# Patient Record
Sex: Female | Born: 1950 | Race: White | Hispanic: No | Marital: Married | Smoking: Never smoker
Health system: Southern US, Community
[De-identification: ages and names within clinical notes are randomized; demographics above are authoritative.]

## PROBLEM LIST (undated history)

## (undated) DIAGNOSIS — M5137 Other intervertebral disc degeneration, lumbosacral region: Secondary | ICD-10-CM

## (undated) DIAGNOSIS — I471 Supraventricular tachycardia, unspecified: Secondary | ICD-10-CM

## (undated) DIAGNOSIS — M51379 Other intervertebral disc degeneration, lumbosacral region without mention of lumbar back pain or lower extremity pain: Secondary | ICD-10-CM

## (undated) DIAGNOSIS — E785 Hyperlipidemia, unspecified: Secondary | ICD-10-CM

## (undated) DIAGNOSIS — Z9889 Other specified postprocedural states: Secondary | ICD-10-CM

## (undated) DIAGNOSIS — I10 Essential (primary) hypertension: Secondary | ICD-10-CM

## (undated) DIAGNOSIS — D649 Anemia, unspecified: Secondary | ICD-10-CM

## (undated) HISTORY — DX: Other intervertebral disc degeneration, lumbosacral region without mention of lumbar back pain or lower extremity pain: M51.379

## (undated) HISTORY — DX: Supraventricular tachycardia: I47.1

## (undated) HISTORY — DX: Hyperlipidemia, unspecified: E78.5

## (undated) HISTORY — DX: Supraventricular tachycardia, unspecified: I47.10

## (undated) HISTORY — DX: Other intervertebral disc degeneration, lumbosacral region: M51.37

## (undated) HISTORY — DX: Anemia, unspecified: D64.9

## (undated) HISTORY — DX: Essential (primary) hypertension: I10

---
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
## 1990-10-21 HISTORY — PX: PARTIAL HYSTERECTOMY: SHX80

## 1995-10-22 HISTORY — PX: BUNIONECTOMY: SHX129

## 2006-09-23 ENCOUNTER — Ambulatory Visit: Payer: Self-pay | Admitting: Family Medicine

## 2006-10-10 ENCOUNTER — Ambulatory Visit: Payer: Self-pay | Admitting: Family Medicine

## 2006-10-23 ENCOUNTER — Ambulatory Visit: Payer: Self-pay | Admitting: Family Medicine

## 2007-02-23 ENCOUNTER — Encounter: Payer: Self-pay | Admitting: Family Medicine

## 2007-11-09 ENCOUNTER — Encounter: Admission: RE | Admit: 2007-11-09 | Discharge: 2007-11-09 | Payer: Self-pay | Admitting: Family Medicine

## 2007-11-13 ENCOUNTER — Encounter (INDEPENDENT_AMBULATORY_CARE_PROVIDER_SITE_OTHER): Payer: Self-pay | Admitting: *Deleted

## 2007-11-19 ENCOUNTER — Encounter: Payer: Self-pay | Admitting: Family Medicine

## 2007-11-19 DIAGNOSIS — M5137 Other intervertebral disc degeneration, lumbosacral region: Secondary | ICD-10-CM | POA: Insufficient documentation

## 2007-11-20 ENCOUNTER — Ambulatory Visit: Payer: Self-pay | Admitting: Family Medicine

## 2007-11-20 DIAGNOSIS — L259 Unspecified contact dermatitis, unspecified cause: Secondary | ICD-10-CM | POA: Insufficient documentation

## 2007-11-23 ENCOUNTER — Telehealth: Payer: Self-pay | Admitting: Family Medicine

## 2007-11-23 DIAGNOSIS — I10 Essential (primary) hypertension: Secondary | ICD-10-CM | POA: Insufficient documentation

## 2007-11-25 ENCOUNTER — Telehealth (INDEPENDENT_AMBULATORY_CARE_PROVIDER_SITE_OTHER): Payer: Self-pay | Admitting: *Deleted

## 2007-11-25 DIAGNOSIS — E781 Pure hyperglyceridemia: Secondary | ICD-10-CM | POA: Insufficient documentation

## 2007-11-25 DIAGNOSIS — R945 Abnormal results of liver function studies: Secondary | ICD-10-CM | POA: Insufficient documentation

## 2007-11-25 LAB — CONVERTED CEMR LAB
BUN: 11 mg/dL (ref 6–23)
Basophils Relative: 0.1 % (ref 0.0–1.0)
Bilirubin, Direct: 0.1 mg/dL (ref 0.0–0.3)
CO2: 30 meq/L (ref 19–32)
Cholesterol: 236 mg/dL (ref 0–200)
Eosinophils Relative: 2.1 % (ref 0.0–5.0)
GFR calc Af Amer: 111 mL/min
Glucose, Bld: 96 mg/dL (ref 70–99)
Hemoglobin: 13.2 g/dL (ref 12.0–15.0)
Lymphocytes Relative: 25.4 % (ref 12.0–46.0)
MCV: 86.5 fL (ref 78.0–100.0)
Monocytes Absolute: 0.5 10*3/uL (ref 0.2–0.7)
Monocytes Relative: 6.8 % (ref 3.0–11.0)
Neutro Abs: 5 10*3/uL (ref 1.4–7.7)
Potassium: 4.3 meq/L (ref 3.5–5.1)
TSH: 1.37 microintl units/mL (ref 0.35–5.50)
Total Protein: 7.6 g/dL (ref 6.0–8.3)
VLDL: 45 mg/dL — ABNORMAL HIGH (ref 0–40)

## 2007-11-30 ENCOUNTER — Telehealth: Payer: Self-pay | Admitting: Family Medicine

## 2007-12-15 ENCOUNTER — Ambulatory Visit: Payer: Self-pay | Admitting: Family Medicine

## 2007-12-18 LAB — CONVERTED CEMR LAB
Albumin: 4.2 g/dL (ref 3.5–5.2)
Alkaline Phosphatase: 115 units/L (ref 39–117)
BUN: 12 mg/dL (ref 6–23)
GFR calc Af Amer: 83 mL/min
Hep A IgM: NEGATIVE
Hep B C IgM: NEGATIVE
Potassium: 4.4 meq/L (ref 3.5–5.1)

## 2008-01-25 ENCOUNTER — Telehealth: Payer: Self-pay | Admitting: Family Medicine

## 2008-01-27 ENCOUNTER — Encounter: Admission: RE | Admit: 2008-01-27 | Discharge: 2008-01-27 | Payer: Self-pay | Admitting: Family Medicine

## 2008-02-01 ENCOUNTER — Ambulatory Visit: Payer: Self-pay | Admitting: Family Medicine

## 2008-02-01 DIAGNOSIS — M25559 Pain in unspecified hip: Secondary | ICD-10-CM | POA: Insufficient documentation

## 2008-02-04 ENCOUNTER — Encounter: Admission: RE | Admit: 2008-02-04 | Discharge: 2008-02-04 | Payer: Self-pay | Admitting: Family Medicine

## 2008-02-08 ENCOUNTER — Encounter: Payer: Self-pay | Admitting: Family Medicine

## 2008-02-15 ENCOUNTER — Encounter: Payer: Self-pay | Admitting: Family Medicine

## 2008-02-15 LAB — HM COLONOSCOPY

## 2008-02-18 ENCOUNTER — Encounter: Payer: Self-pay | Admitting: Family Medicine

## 2008-04-11 ENCOUNTER — Ambulatory Visit: Payer: Self-pay | Admitting: Family Medicine

## 2008-04-20 LAB — CONVERTED CEMR LAB
Cholesterol: 221 mg/dL (ref 0–200)
Direct LDL: 148.7 mg/dL
Triglycerides: 134 mg/dL (ref 0–149)

## 2008-12-19 LAB — CONVERTED CEMR LAB

## 2008-12-26 ENCOUNTER — Encounter: Admission: RE | Admit: 2008-12-26 | Discharge: 2008-12-26 | Payer: Self-pay | Admitting: Family Medicine

## 2008-12-30 ENCOUNTER — Encounter: Payer: Self-pay | Admitting: Family Medicine

## 2009-01-16 ENCOUNTER — Ambulatory Visit: Payer: Self-pay | Admitting: Family Medicine

## 2009-01-16 DIAGNOSIS — M8580 Other specified disorders of bone density and structure, unspecified site: Secondary | ICD-10-CM | POA: Insufficient documentation

## 2009-01-16 LAB — CONVERTED CEMR LAB: HDL goal, serum: 40 mg/dL

## 2009-01-19 LAB — CONVERTED CEMR LAB
ALT: 26 units/L (ref 0–35)
AST: 25 units/L (ref 0–37)
Albumin: 4.4 g/dL (ref 3.5–5.2)
Alkaline Phosphatase: 99 units/L (ref 39–117)
CO2: 30 meq/L (ref 19–32)
Calcium: 9.4 mg/dL (ref 8.4–10.5)
Glucose, Bld: 92 mg/dL (ref 70–99)
Sodium: 140 meq/L (ref 135–145)
Total Protein: 7.4 g/dL (ref 6.0–8.3)
Triglycerides: 164 mg/dL — ABNORMAL HIGH (ref 0.0–149.0)

## 2009-01-20 ENCOUNTER — Encounter: Payer: Self-pay | Admitting: Family Medicine

## 2009-01-20 ENCOUNTER — Encounter: Admission: RE | Admit: 2009-01-20 | Discharge: 2009-01-20 | Payer: Self-pay | Admitting: Family Medicine

## 2009-05-02 ENCOUNTER — Ambulatory Visit: Payer: Self-pay | Admitting: Family Medicine

## 2009-05-05 LAB — CONVERTED CEMR LAB
ALT: 44 units/L — ABNORMAL HIGH (ref 0–35)
AST: 31 units/L (ref 0–37)
Total CHOL/HDL Ratio: 4
Triglycerides: 115 mg/dL (ref 0.0–149.0)

## 2009-08-02 ENCOUNTER — Ambulatory Visit: Payer: Self-pay | Admitting: Family Medicine

## 2009-08-02 DIAGNOSIS — E7800 Pure hypercholesterolemia, unspecified: Secondary | ICD-10-CM | POA: Insufficient documentation

## 2009-08-04 LAB — CONVERTED CEMR LAB
Cholesterol: 163 mg/dL (ref 0–200)
Triglycerides: 139 mg/dL (ref 0.0–149.0)

## 2010-01-08 ENCOUNTER — Encounter: Admission: RE | Admit: 2010-01-08 | Discharge: 2010-01-08 | Payer: Self-pay | Admitting: Family Medicine

## 2010-01-14 ENCOUNTER — Emergency Department: Payer: Self-pay | Admitting: Emergency Medicine

## 2010-03-14 ENCOUNTER — Ambulatory Visit: Payer: Self-pay | Admitting: Family Medicine

## 2010-03-14 DIAGNOSIS — G4700 Insomnia, unspecified: Secondary | ICD-10-CM | POA: Insufficient documentation

## 2010-03-14 LAB — HM PAP SMEAR

## 2010-03-14 LAB — CONVERTED CEMR LAB

## 2010-03-15 LAB — CONVERTED CEMR LAB
AST: 40 units/L — ABNORMAL HIGH (ref 0–37)
Alkaline Phosphatase: 106 units/L (ref 39–117)
Bilirubin, Direct: 0.1 mg/dL (ref 0.0–0.3)
CO2: 30 meq/L (ref 19–32)
Calcium: 9.9 mg/dL (ref 8.4–10.5)
Chloride: 103 meq/L (ref 96–112)
Creatinine, Ser: 0.7 mg/dL (ref 0.4–1.2)
Direct LDL: 127.9 mg/dL
Glucose, Bld: 98 mg/dL (ref 70–99)
Total Bilirubin: 0.7 mg/dL (ref 0.3–1.2)
Total CHOL/HDL Ratio: 4
Vit D, 25-Hydroxy: 40 ng/mL (ref 30–89)

## 2010-07-02 ENCOUNTER — Telehealth: Payer: Self-pay | Admitting: Family Medicine

## 2010-07-09 ENCOUNTER — Telehealth: Payer: Self-pay | Admitting: Family Medicine

## 2010-10-29 ENCOUNTER — Ambulatory Visit
Admission: RE | Admit: 2010-10-29 | Discharge: 2010-10-29 | Payer: Self-pay | Source: Home / Self Care | Attending: Family Medicine | Admitting: Family Medicine

## 2010-10-29 ENCOUNTER — Other Ambulatory Visit: Payer: Self-pay | Admitting: Family Medicine

## 2010-10-29 ENCOUNTER — Telehealth (INDEPENDENT_AMBULATORY_CARE_PROVIDER_SITE_OTHER): Payer: Self-pay | Admitting: *Deleted

## 2010-10-29 LAB — HEPATIC FUNCTION PANEL
ALT: 26 U/L (ref 0–35)
AST: 21 U/L (ref 0–37)
Albumin: 4.3 g/dL (ref 3.5–5.2)
Alkaline Phosphatase: 101 U/L (ref 39–117)
Bilirubin, Direct: 0.1 mg/dL (ref 0.0–0.3)
Total Bilirubin: 0.9 mg/dL (ref 0.3–1.2)
Total Protein: 7.3 g/dL (ref 6.0–8.3)

## 2010-10-29 LAB — CBC WITH DIFFERENTIAL/PLATELET
Basophils Absolute: 0 10*3/uL (ref 0.0–0.1)
Basophils Relative: 0.6 % (ref 0.0–3.0)
Eosinophils Absolute: 0.1 10*3/uL (ref 0.0–0.7)
Eosinophils Relative: 2 % (ref 0.0–5.0)
HCT: 39.3 % (ref 36.0–46.0)
Hemoglobin: 13.5 g/dL (ref 12.0–15.0)
Lymphocytes Relative: 26.4 % (ref 12.0–46.0)
Lymphs Abs: 1.7 10*3/uL (ref 0.7–4.0)
MCHC: 34.4 g/dL (ref 30.0–36.0)
MCV: 87.3 fl (ref 78.0–100.0)
Monocytes Absolute: 0.5 10*3/uL (ref 0.1–1.0)
Monocytes Relative: 8.3 % (ref 3.0–12.0)
Neutro Abs: 4.1 10*3/uL (ref 1.4–7.7)
Neutrophils Relative %: 62.7 % (ref 43.0–77.0)
Platelets: 297 10*3/uL (ref 150.0–400.0)
RBC: 4.5 Mil/uL (ref 3.87–5.11)
RDW: 13 % (ref 11.5–14.6)
WBC: 6.6 10*3/uL (ref 4.5–10.5)

## 2010-10-29 LAB — BASIC METABOLIC PANEL
BUN: 14 mg/dL (ref 6–23)
CO2: 27 mEq/L (ref 19–32)
Calcium: 9.7 mg/dL (ref 8.4–10.5)
Chloride: 106 mEq/L (ref 96–112)
Creatinine, Ser: 0.8 mg/dL (ref 0.4–1.2)
GFR: 82.56 mL/min (ref >60.00)
Glucose, Bld: 95 mg/dL (ref 70–99)
Potassium: 4.6 mEq/L (ref 3.5–5.1)
Sodium: 140 mEq/L (ref 135–145)

## 2010-10-29 LAB — LIPID PANEL
Cholesterol: 158 mg/dL (ref 0–200)
HDL: 39.3 mg/dL (ref >39.00)
LDL Cholesterol: 97 mg/dL (ref 0–99)
Total CHOL/HDL Ratio: 4
Triglycerides: 108 mg/dL (ref 0.0–149.0)
VLDL: 21.6 mg/dL (ref 0.0–40.0)

## 2010-10-30 ENCOUNTER — Ambulatory Visit: Admit: 2010-10-30 | Payer: Self-pay | Admitting: Family Medicine

## 2010-10-30 ENCOUNTER — Encounter (INDEPENDENT_AMBULATORY_CARE_PROVIDER_SITE_OTHER): Payer: Self-pay | Admitting: *Deleted

## 2010-11-12 ENCOUNTER — Telehealth: Payer: Self-pay | Admitting: Family Medicine

## 2010-11-20 NOTE — Progress Notes (Signed)
Summary: Martha Hayes  Phone Note Refill Request Message from:  Patient on July 09, 2010 1:24 PM  Refills Requested: Medication #1:  ZOLPIDEM TARTRATE 10 MG TABS 1/2  to 1 tab by mouth at bedtime as needed for insomnia.   Supply Requested: 1 month   Last Ref[facility]: [facility]/2010 [facility] [phone]   Method Requested: Telephone to Pharmacy Initial call taken by: Benny Lennert CMA Duncan Dull),  July 09, 2010 1:25 PM  Follow-up for Phone Call        Rx called to pharmacy Follow-up by: Benny Lennert CMA Duncan Dull),  July 10, 2010 8:32 AM    Prescriptions: ZOLPIDEM TARTRATE 10 MG TABS (ZOLPIDEM TARTRATE) 1/2  to 1 tab by mouth at bedtime as needed for insomnia  #30 x 0   Entered and Authorized by:   Kerby Nora MD   Signed by:   Kerby Nora MD on 07/10/2010   Method used:   Telephoned to ...       [facility] [facility]ail[facility]       [address]       [place], [place]  [address]       Ph: [phone] or [phone]       Fax: [phone]   RxID:   [id number]

## 2010-11-20 NOTE — Progress Notes (Signed)
Summary: refill request for ambien  Phone Note Refill Request Message from:  Fax from Pharmacy  Refills Requested: Medication #1:  ZOLPIDEM TARTRATE 10 MG TABS 1/2  to 1 tab by mouth at bedtime as needed for insomnia.   Last Refilled: 03/20/2010 [facility]ax[facility] [address]  Initial call taken by: Lowella Petties CMA,  July 02, 2010 11:15 AM  Follow-up for Phone Call        Rx called to pharmacy Follow-up by: Benny Lennert CMA Duncan Dull),  July 03, 2010 8:45 AM    Prescriptions: ZOLPIDEM TARTRATE 10 MG TABS (ZOLPIDEM TARTRATE) 1/2  to 1 tab by mouth at bedtime as needed for insomnia  #30 x 0   Entered and Authorized by:   Kerby Nora MD   Signed by:   Kerby Nora MD on 07/03/2010   Method used:   Telephoned to ...       [facility] (retail)       [address]       [place], [place]  [address]       Ph: [phone] or [phone]       Fax: [phone]   RxID:   [id number]

## 2010-11-20 NOTE — Assessment & Plan Note (Signed)
Summary: CPX/CLE   Vital Signs:  Patient profile:   60 year old female Height:      62.25 inches Weight:      138.8 pounds BMI:     25.27 Temp:     97.9 degrees F oral Pulse rate:   80 / minute Pulse rhythm:   regular BP sitting:   110 / 62  (left arm) Cuff size:   regular  Vitals Entered By: Benny Lennert CMA Duncan Dull) (Mar 14, 2010 11:24 AM)  History of Present Illness: Chief complaint cpx  The patient is here for annual wellness exam and preventative care.    Some insomnia...intermittantly..sleeps 3-4 hours at night.  requesting ambien prescription.   Minimal execise.  Osteopenia..DXA last year..On calcium and vit D.   Hypertension History:      Well controlled at home. Marland Kitchen        Positive major cardiovascular risk factors include female age 69 years old or older, hyperlipidemia, and hypertension.  Negative major cardiovascular risk factors include non-tobacco-user status.     Problems Prior to Update: 1)  Pure Hypercholesterolemia  (ICD-272.0) 2)  Special Screening For Osteoporosis  (ICD-V82.81) 3)  Hip Pain, Left  (ICD-719.45) 4)  Liver Function Tests, Abnormal  (ICD-794.8) 5)  Hypertriglyceridemia  (ICD-272.1) 6)  Hypertension, Mild  (ICD-401.1) 7)  Well Adult  (ICD-V70.0) 8)  Screening For Lipoid Disorders  (ICD-V77.91) 9)  Eczema  (ICD-692.9) 10)  Degenerative Disc Disease, Lumbosacral Spine  (ICD-722.52) 11)  Anemia-due To Menorrhagia  (ICD-285.9)  Allergies: 1)  * Penicillins Group  Past History:  Past medical, surgical, family and social histories (including risk factors) reviewed, and no changes noted (except as noted below).  Past Medical History: Reviewed history from 11/19/2007 and no changes required. Current Problems:  DEGENERATIVE DISC DISEASE, LUMBOSACRAL SPINE (ICD-722.52) ANEMIA-DUE TO MENORRHAGIA (ICD-285.9)    Past Surgical History: Reviewed history from 11/19/2007 and no changes required. 1992    Partial hysterectomy:  uterine  fibroids 1997    Bunionectomy, right 1959    tonsillectomy 11/2003 DEXA 2003    MRI lumbar:  DDD  Family History: Reviewed history from 11/19/2007 and no changes required. Father: Alive 43, DM, HTN, partial petit mal seizure disorder Mother: Died 62, CAD, two stents, DM developed in 14's, (+) osteoporosis Siblings: 1 sister, healthy No MI < 10  Social History: Reviewed history from 11/19/2007 and no changes required. Never Smoked Alcohol use-yes, occasional wine every 2 months or so Drug use-no Regular exercise-no Marital Status: Married x 36 years Children: 2, DM Type I - age 23 Occupation: RN at [employer] Diet:  fruit, veggies, no H2O, (+) soda, (+) tea, (+) coffee  Review of Systems General:  Denies fatigue and fever. CV:  Denies chest pain or discomfort. Resp:  Denies shortness of breath, sputum productive, and wheezing. GI:  Complains of bloody stools; denies abdominal pain, constipation, and diarrhea; Rarely from hemmorhoids. GU:  Denies abnormal vaginal bleeding, dysuria, nocturia, urinary frequency, and urinary hesitancy. Derm:  Denies lesion(s). Neuro:  Denies headaches and numbness. Psych:  Denies anxiety and depression.  Physical Exam  General:  Well-developed,well-nourished,in no acute distress; alert,appropriate and cooperative throughout examination Eyes:  No corneal or conjunctival inflammation noted. EOMI. Perrla. Funduscopic exam benign, without hemorrhages, exudates or papilledema. Vision grossly normal. Ears:  External ear exam shows no significant lesions or deformities.  Otoscopic examination reveals clear canals, tympanic membranes are intact bilaterally without bulging, retraction, inflammation or discharge. Hearing is grossly normal bilaterally. Nose:  External  nasal examination shows no deformity or inflammation. Nasal mucosa are pink and moist without lesions or exudates. Mouth:  Oral mucosa and oropharynx without lesions or exudates.  Teeth in good  repair. Neck:  no carotid bruit or thyromegaly no cervical or supraclavicular lymphadenopathy  Chest Wall:  No deformities, masses, or tenderness noted. Breasts:  No mass, nodules, thickening, tenderness, bulging, retraction, inflamation, nipple discharge or skin changes noted.   Lungs:  Normal respiratory effort, chest expands symmetrically. Lungs are clear to auscultation, no crackles or wheezes. Heart:  Normal rate and regular rhythm. S1 and S2 normal without gallop, murmur, click, rub or other extra sounds. Abdomen:  Bowel sounds positive,abdomen soft and non-tender without masses, organomegaly or hernias noted. Genitalia:  Pelvic Exam:        External: normal female genitalia without lesions or masses        Vagina: normal without lesions or masses Cervix: none        Adnexa: normal bimanual exam without masses or fullness        Uterus:none        Pap smear: not performed Pulses:  R and L posterior tibial pulses are full and equal bilaterally  Extremities:  no edmea Neurologic:  No cranial nerve deficits noted. Station and gait are normal. Plantar reflexes are down-going bilaterally. DTRs are symmetrical throughout. Sensory, motor and coordinative functions appear intact. Skin:  Intact without suspicious lesions or rashes Psych:  Cognition and judgment appear intact. Alert and cooperative with normal attention span and concentration. No apparent delusions, illusions, hallucinations   Impression & Recommendations:  Problem # 1:  WELL ADULT (ICD-V70.0) The patient's preventative maintenance and recommended screening tests for an annual wellness exam were reviewed in full today. Brought up to date unless services declined.  Counselled on the importance of diet, exercise, and its role in overall health and mortality. The patient's FH and SH was reviewed, including their home life, tobacco status, and drug and alcohol status.     Problem # 2:  Gynecological examination-routine  (ICD-V72.31) DVE no pap.   Problem # 3:  INSOMNIA, CHRONIC (ICD-307.42) > than 2 months.  Reviewed sleep hygeine.  Treat with ambien in limited fashoin.   Problem # 4:  PURE HYPERCHOLESTEROLEMIA (ICD-272.0) Due forreeval.  Her updated medication list for this problem includes:    Simvastatin 40 Mg Tabs (Simvastatin) .Marland Kitchen... Take 1/2  tablet by mouth once a day  Problem # 5:  OSTEOPENIA (ICD-733.90) Eval vit D. Continue ca and vit D. Start weight bearing exercsie.  Her updated medication list for this problem includes:    Calcium Citrate-vitamin D 315-200 Mg-unit Tabs (Calcium citrate-vitamin d) .Marland Kitchen... Take 1 tablet by mouth two times a day  Orders: T-Vitamin D (25-Hydroxy) ([id number])  Problem # 6:  LIVER FUNCTION TESTS, ABNORMAL (ICD-794.8) Resolved on lower dose of simvastain.Marland Kitchenreeval.  Orders: TLB-Hepatic/Liver Function Pnl ([id number]-[id number])  Complete Medication List: 1)  Ultravate 0.05 % Oint (Halobetasol propionate) .... Aaa two times a day x 2 weeks 2)  Lisinopril-hydrochlorothiazide 10-12.5 Mg Tabs (Lisinopril-hydrochlorothiazide) .... Take 1 tablet by mouth once a day 3)  Calcium Citrate-vitamin D 315-200 Mg-unit Tabs (Calcium citrate-vitamin d) .... Take 1 tablet by mouth two times a day 4)  Simvastatin 40 Mg Tabs (Simvastatin) .... Take 1/2  tablet by mouth once a day 5)  Zolpidem Tartrate 10 Mg Tabs (Zolpidem tartrate) .... 1/2  to 1 tab by mouth at bedtime as needed for insomnia  Other Orders: TLB-BMP (Basic Metabolic Panel-BMET) (80048-METABOL)  TLB-Lipid Panel (80061-LIPID)  Hypertension Assessment/Plan:      The patient's hypertensive risk group is category B: At least one risk factor (excluding diabetes) with no target organ damage.  Her calculated 10 year risk of coronary heart disease is 7 %.  Today's blood pressure is 110/62.  Her blood pressure goal is < 140/90.  Patient Instructions: 1)  Start weight bearing exercise.  2)  Sleep hygiene. 3)  Please schedule  a follow-up appointment in 1 year with fasting labs prior.  Prescriptions: SIMVASTATIN 40 MG TABS (SIMVASTATIN) Take 1/2  tablet by mouth once a day  #45 x 3   Entered and Authorized by:   Kerby Nora MD   Signed by:   Kerby Nora MD on 03/14/2010   Method used:   [facility]        [facility] [id number]       [address]       [place], [place]  [address]       Ph: [phone] or [phone]       Fax: [phone]   RxID:   [id number] ULTRAVATE 0.05 %  OINT (HALOBETASOL PROPIONATE) AAA two times a day x 2 weeks  #15 gm tube x 1   Entered and Authorized by:   Kerby Nora MD   Signed by:   Kerby Nora MD on 03/14/2010   Method used:   [facility]ically [facility]        [facility] [id number]       [address]       [place], [place]  [address]       Ph: [phone] or [phone]       Fax: [phone]   RxID:   [id number] ZOLPIDEM TARTRATE 10 MG TABS (ZOLPIDEM TARTRATE) 1/2  to 1 tab by mouth at bedtime as needed for insomnia  #30 x 0   Entered and Authorized by:   Kerby Nora MD   Signed by:   Kerby Nora MD on 03/14/2010   Method used:   Print then Give to Patient   RxID:   [id number] LISINOPRIL-HYDROCHLOROTHIAZIDE 10-12.5 MG  TABS (LISINOPRIL-HYDROCHLOROTHIAZIDE) Take 1 tablet by mouth once a day  #90 x 3   Entered and Authorized by:   Kerby Nora MD   Signed by:   Kerby Nora MD on 03/14/2010   Method used:   [facility]ronically [facility]        [facility] [id number]       [address]       [place], [place]  [address]       Ph: [phone] or [phone]       Fax: [phone]   RxID:   [id number]   Current Allergies (reviewed today): * PENICILLINS GROUP  Last Flu Vaccine:  Historical (07/22/2007 11:34:48 AM) Flu Vaccine Result Date:  07/21/2009 Flu Vaccine Result:  given Flu Vaccine Next Due:  1 yr Flex Sig Next Due:  Not Indicated Last PAP:  DVE no pap (12/19/2008 11:06:50 PM) PAP Result Date:   03/14/2010 PAP Result:  DVE no pap  PAP Next Due:  1 yr    Past Medical History:    Reviewed history from 11/19/2007 and no changes required:       Current Problems:        DEGENERATIVE DISC DISEASE, LUMBOSACRAL SPINE (ICD-722.52)       ANEMIA-DUE TO MENORRHAGIA (ICD-285.9)          Past Surgical History:  Reviewed history from 11/19/2007 and no changes required:       1992    Partial hysterectomy:  uterine fibroids       1997    Bunionectomy, right       1959    tonsillectomy       11/2003 DEXA       2003    MRI lumbar:  DDD  Appended Document: CPX/CLE

## 2010-11-22 NOTE — Progress Notes (Signed)
Summary: ZOLPIDEM TARTRATE  Phone Note Refill Request Message from:  Fax from Pharmacy on November 12, 2010 9:23 AM  Refills Requested: Medication #1:  ZOLPIDEM TARTRATE 10 MG TABS 1/2  to 1 tab by mouth at bedtime as needed for insomnia.   Last Refilled: 07/10/2010 [facility] [phone]  Initial call taken by: Melody Comas,  November 12, 2010 9:23 AM  Follow-up for Phone Call        Rx called to pharmacy  Additional Follow-up for Phone Call Additional follow up Details #1::       Additional Follow-up by: Benny Lennert CMA Duncan Dull),  November 13, 2010 7:33 AM    Prescriptions: ZOLPIDEM TARTRATE 10 MG TABS (ZOLPIDEM TARTRATE) 1/2  to 1 tab by mouth at bedtime as needed for insomnia  #30 x 0   Entered and Authorized by:   Kerby Nora MD   Signed by:   Kerby Nora MD on 11/12/2010   Method used:   Telephoned to ...       [facility] (retail)       [address]       [place], [place]  [address]       Ph: [phone] or [phone]       Fax: [phone]   RxID:   [id number]

## 2010-11-22 NOTE — Progress Notes (Signed)
----   Converted from flag ---- ---- 10/26/2010 9:57 PM, Kerby Nora MD wrote: Dx 272.0 CMET, lipids, Dx 285.9 cbc   ---- 10/26/2010 12:12 PM, Liane Comber CMA (AAMA) wrote: Lab orders please! Good Morning! This pt is scheduled for cpx labs Monday, which labs to draw and dx codes to use? Thanks Tasha ------------------------------

## 2010-11-22 NOTE — Letter (Signed)
Summary: Results Follow up Letter  [place] [facility]  [address] [place], [place] [address]   Phone: [phone]  Fax: [phone]    10/30/2010 MRN: [id number]     [facility] [address] [place], [place]  [address]    Dear Ms. Ferrington,  The following are the results of your recent test(s):  Test         Result    Pap Smear:        Normal _____  Not Normal _____ Comments: ______________________________________________________ Cholesterol: LDL at goal <130, good HDl and triglycerides at goal, normal kidney function , normal  liver function and  no diabetes.  no anemia.  ______________________________________________________ Mammogram:        Normal _____  Not Normal _____ Comments:  ___________________________________________________________________ Hemoccult:        Normal _____  Not normal _______ Comments:    _____________________________________________________________________ Other Tests:    We routinely do not discuss normal results over the telephone.  If you desire a copy of the results, or you have any questions about this information we can discuss them at your next office visit.   Sincerely,  Kerby Nora MD

## 2011-01-11 ENCOUNTER — Other Ambulatory Visit: Payer: Self-pay | Admitting: Family Medicine

## 2011-01-11 DIAGNOSIS — Z1231 Encounter for screening mammogram for malignant neoplasm of breast: Secondary | ICD-10-CM

## 2011-01-15 ENCOUNTER — Other Ambulatory Visit: Payer: Self-pay | Admitting: *Deleted

## 2011-01-15 MED ORDER — ZOLPIDEM TARTRATE 10 MG PO TABS: ORAL_TABLET | ORAL | Status: DC

## 2011-01-15 NOTE — Telephone Encounter (Signed)
Notify pt prescription called in.

## 2011-01-15 NOTE — Telephone Encounter (Signed)
Rx called to pharmacy, patient notified. 

## 2011-01-15 NOTE — Telephone Encounter (Signed)
Opened in error. Medication already called in to pharmacy.

## 2011-01-21 ENCOUNTER — Ambulatory Visit
Admission: RE | Admit: 2011-01-21 | Discharge: 2011-01-21 | Disposition: A | Discharge status: Home / Self Care | Payer: BC Managed Care – PPO | Source: Ambulatory Visit | Attending: Family Medicine | Admitting: Family Medicine

## 2011-01-21 DIAGNOSIS — Z1231 Encounter for screening mammogram for malignant neoplasm of breast: Secondary | ICD-10-CM

## 2011-01-22 ENCOUNTER — Encounter: Payer: Self-pay | Admitting: Family Medicine

## 2011-01-22 ENCOUNTER — Ambulatory Visit (INDEPENDENT_AMBULATORY_CARE_PROVIDER_SITE_OTHER): Payer: BC Managed Care – PPO | Admitting: Family Medicine

## 2011-01-22 DIAGNOSIS — F411 Generalized anxiety disorder: Secondary | ICD-10-CM

## 2011-01-22 MED ORDER — SERTRALINE HCL 50 MG PO TABS: 50.0000 mg | ORAL_TABLET | Freq: Every day | ORAL | Status: DC

## 2011-01-22 NOTE — Patient Instructions (Signed)
Work on stress reduction and relaxation techniques. Start regular exercise. Start sertraline at bedtime.

## 2011-01-22 NOTE — Progress Notes (Signed)
  Subjective:    Patient ID: Martha Hayes, female    DOB: 1951-09-30, 60 y.o.   MRN: [id number]  HPI  In last 3-6 months she has had worsened anxiety. At work she feels very anxious, overwhelmed. Constantly while she is at work.. Does well at home.  No panic attacks.. More generalized symptoms. No SI. No depression.  Sleeping at night on [place].. Using 1/2 tab nightly.  Never been on SSRI in past.  Plans to retire in 2 years.   Review of Systems     Objective:   Physical Exam        Assessment & Plan:

## 2011-01-22 NOTE — Assessment & Plan Note (Signed)
New diagnosis but chronic. Start stress reduction relaxation and SSRI.  Discussed risk and benefits of medication... As well as expected SE profile and expected effectiveness timeline.

## 2011-01-24 ENCOUNTER — Encounter: Payer: Self-pay | Admitting: *Deleted

## 2011-03-01 ENCOUNTER — Ambulatory Visit: Payer: BC Managed Care – PPO | Admitting: Family Medicine

## 2011-03-08 NOTE — Assessment & Plan Note (Signed)
[facility]                           [facility] OFFICE NOTE   AMAYRANI, BENNICK                           MRN:          [id number]  DATE:09/23/2006                            DOB:          1951-01-07    CHIEF COMPLAINT:  A 60 year old white female here to establish new  doctor.   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  She comes to the clinic today to be set up  with a primary care doctor.  She states that she has been doing very  well and does not have any specific complaints.  She does report that  she had a history of blood in her stool in 2005.  She had a normal  colonoscopy.  At that point in time, though, the GI doctor performing  the colonoscopy had recommended following up with stool cards.  She  never did follow up with stool cards.  She would like this done at this  point in time.   REVIEW OF SYSTEMS:  No headache, no dizziness, no syncope, no chest  pain, no palpitations, no dyspnea, no appetite problems, no nausea, no  vomiting, no diarrhea.  Occasional constipation.  History of occasional  small amount of bright red blood on toilet tissue after straining.   PAST MEDICAL HISTORY:  1. History of anemia secondary to menorrhagia now resolved.  2. Degenerative disk disease low back.   HOSPITALIZATIONS SURGERIES PROCEDURES:  1. 1992 partial hysterectomy for uterine fibroids.  2. 1997 bunionectomy on the right.  3. 1959 tonsillectomy.  4. February of 2005 DEXA scan.  5. 2003 MRI of lumbar spine showing degenerative disk disease.  6. Mammogram December of 2007 negative.  7. Colonoscopy negative in 2005.   FAMILY HISTORY:  Father alive at age 6 with diabetes, hypertension, and  partial seizure disorder.  Mother deceased at age 27 with coronary  artery disease, status post two stents, diabetes, osteoporosis.  There  is no family history of MI before age 71.  She has one  sister who is  healthy.  There is no family history of cancer.   SOCIAL HISTORY:  She is a Engineer, civil (consulting) at [employer].  She has  been married for 36 years.  She has two children, one of whom has  diabetes type 1 at age 70.  No regular exercise.  Diet includes  occasional fruits and vegetables, limited water.  She also drinks soda,  tea, and coffee.   PHYSICAL EXAMINATION:  VITAL SIGNS:  Height 62-1/2 inches, weight 133.6,  blood pressure 122/72, pulse 80, temperature 98.2.  GENERAL:  Healthy appearing female in no acute distress.  HEENT:  PERRLA.  Extraocular muscles intact.  Oropharynx clear.  Tympanic membranes clear.  No thyromegaly and no lymphadenopathy  supraclavicular or cervical.  HEART:  Regular rate and rhythm.  No murmurs, rubs, or gallops.  Normal  PMI, 2+ peripheral pulses, no peripheral edema.  LUNGS:  Clear to auscultation bilaterally, no wheezes, rales, or  rhonchi.  ABDOMEN:  Soft and nontender.  Normal active bowel sounds, no  hepatosplenomegaly.  MUSCULOSKELETAL:  Strength 5/5 in upper and lower extremities.  Right  thumb tender to palpation at base, clicking and popping noted as well as  tendon nodule palpated suggesting trigger finger.  Full strength in  thumb abduction, adduction, extension, and flexion.  NEUROLOGY:  Cranial nerves II-XII grossly intact.  Alert and oriented  x3.  Reflexes 2+ upper and lower extremities.   ASSESSMENT:  1. Follow up on blood in stool.  This is most likely secondary to      straining and possible rectal fissure.  She had no evidence of      hemorrhoids on colonoscopy.  I will have her repeat a hemoccult to      make sure that there is no continued small loss of blood.  2. Right trigger thumb.  She will use ibuprofen, ice, and will tape      her finger to see if this improves her symptoms over time.  She      will return if she continues to have problems for a trigger finger      nodule steroid injection.  3.  Prevention; she is currently up-to-date with prevention.  We did      discuss checking a cholesterol panel and a diabetes screen.  She      did have this and it was normal in September of 2006.  She is at      her goal cholesterol less than 160.  We will hold off and check      cholesterol next year.     Kerby Nora, MD  Electronically Signed    AB/MedQ  DD: 09/23/2006  DT: 09/24/2006  Job #: [id number]

## 2011-03-11 ENCOUNTER — Other Ambulatory Visit: Payer: Self-pay | Admitting: *Deleted

## 2011-03-11 MED ORDER — ZOLPIDEM TARTRATE 10 MG PO TABS: ORAL_TABLET | ORAL | Status: DC

## 2011-03-14 ENCOUNTER — Other Ambulatory Visit: Payer: Self-pay | Admitting: *Deleted

## 2011-04-11 ENCOUNTER — Other Ambulatory Visit: Payer: Self-pay | Admitting: Family Medicine

## 2011-04-15 ENCOUNTER — Ambulatory Visit (INDEPENDENT_AMBULATORY_CARE_PROVIDER_SITE_OTHER): Payer: BC Managed Care – PPO | Admitting: Family Medicine

## 2011-04-15 ENCOUNTER — Encounter: Payer: Self-pay | Admitting: Family Medicine

## 2011-04-15 DIAGNOSIS — Z23 Encounter for immunization: Secondary | ICD-10-CM

## 2011-04-15 DIAGNOSIS — G4700 Insomnia, unspecified: Secondary | ICD-10-CM

## 2011-04-15 DIAGNOSIS — R945 Abnormal results of liver function studies: Secondary | ICD-10-CM

## 2011-04-15 DIAGNOSIS — Z2911 Encounter for prophylactic immunotherapy for respiratory syncytial virus (RSV): Secondary | ICD-10-CM

## 2011-04-15 DIAGNOSIS — E7800 Pure hypercholesterolemia, unspecified: Secondary | ICD-10-CM

## 2011-04-15 DIAGNOSIS — Z0000 Encounter for general adult medical examination without abnormal findings: Secondary | ICD-10-CM

## 2011-04-15 DIAGNOSIS — F411 Generalized anxiety disorder: Secondary | ICD-10-CM

## 2011-04-15 DIAGNOSIS — I10 Essential (primary) hypertension: Secondary | ICD-10-CM

## 2011-04-15 MED ORDER — SERTRALINE HCL 100 MG PO TABS: 100.0000 mg | ORAL_TABLET | Freq: Every day | ORAL | Status: DC

## 2011-04-15 MED ORDER — ZOLPIDEM TARTRATE 10 MG PO TABS: 5.0000 mg | ORAL_TABLET | Freq: Every evening | ORAL | Status: DC | PRN

## 2011-04-15 NOTE — Assessment & Plan Note (Signed)
Refilled ambien daily.

## 2011-04-15 NOTE — Patient Instructions (Addendum)
Okay to stop BP medicaiton. Follow BPs closely. Restart medication if BPs running 140/90. Increase sertraline to 100 mg daily. Okay to stop cholesterol medication.  Return for fasting lipids in 3 months.   When scheduling the mammogram next year remember to schedule bone density as well.

## 2011-04-15 NOTE — Assessment & Plan Note (Signed)
Improved 80%, but still room for improvement. Will try trial of 100 mg daily.

## 2011-04-15 NOTE — Progress Notes (Signed)
Subjective:    Patient ID: Martha Hayes, female    DOB: [birth year], 60 y.o.   MRN: [id number]  HPI  The patient is here for annual wellness exam and preventative care.    Elevated Cholesterol: well controlled on simvastatin 40 mg daily. Using medications without problems: Yes Muscle aches: None Has been working on [employer]... She has lost 10 lbs. Minimal exercise.  Interested in stopping chol med.   LFTs in past elevated.Marland Kitchen Resolved on lower dose statin.    Hypertension:  Well controlled on lisinopril HCTZ.   Using medication without problems or lightheadedness:  Chest pain with exertion:None Edema:None Short of breath:None Average home BPs: well controlled...has lost 10 lbs since last summer, and improvement in anxiety is helping BP. She is intrested in trial off BP meds.  Other issues:   Anxiety: On sertraline 50 mg since April  Using ambien at bedtime for insomnia.  She feels like she is about 80% better on the current dose, but still having some overwhelming feelings. She is interested in higher dose of the medicine. No SE to the medication.  Review of Systems     Objective:   Physical Exam  Constitutional: Vital signs are normal. She appears well-developed and well-nourished. She is cooperative.  Non-toxic appearance. She does not appear ill. No distress.  HENT:  Head: Normocephalic.  Right Ear: Hearing, tympanic membrane, external ear and ear canal normal.  Left Ear: Hearing, tympanic membrane, external ear and ear canal normal.  Nose: Nose normal.  Eyes: Conjunctivae, EOM and lids are normal. Pupils are equal, round, and reactive to light. No foreign bodies found.  Neck: Trachea normal and normal range of motion. Neck supple. Carotid bruit is not present. No mass and no thyromegaly present.  Cardiovascular: Normal rate, regular rhythm, S1 normal, S2 normal, normal heart sounds and intact distal pulses.  Exam reveals no gallop.   No murmur  heard. Pulmonary/Chest: Effort normal and breath sounds normal. No respiratory distress. She has no wheezes. She has no rhonchi. She has no rales.  Abdominal: Soft. Normal appearance and bowel sounds are normal. She exhibits no distension, no fluid wave, no abdominal bruit and no mass. There is no hepatosplenomegaly. There is no tenderness. There is no rebound, no guarding and no CVA tenderness. No hernia. Hernia confirmed negative in the right inguinal area and confirmed negative in the left inguinal area.  Genitourinary: Vagina normal. No breast swelling, tenderness, discharge or bleeding. Pelvic exam was performed with patient prone. No labial fusion. There is no rash, tenderness, lesion or injury on the right labia. There is no rash, tenderness, lesion or injury on the left labia. Right adnexum displays no mass, no tenderness and no fullness. Left adnexum displays no mass, no tenderness and no fullness. No erythema, tenderness or bleeding around the vagina. No foreign body around the vagina. No signs of injury around the vagina. No vaginal discharge found.  Lymphadenopathy:    She has no cervical adenopathy.    She has no axillary adenopathy.       Right: No inguinal adenopathy present.       Left: No inguinal adenopathy present.  Neurological: She is alert. She has normal strength. No cranial nerve deficit or sensory deficit.  Skin: Skin is warm, dry and intact. No rash noted.  Psychiatric: She has a normal mood and affect. Her speech is normal and behavior is normal. Judgment normal. Her mood appears not anxious. Cognition and memory are normal. She does not exhibit  a depressed mood.          Assessment & Plan:

## 2011-04-15 NOTE — Assessment & Plan Note (Signed)
Well controlled... Will try trial off BP med. Follow  BP at home.

## 2011-04-15 NOTE — Assessment & Plan Note (Signed)
Very well controlled. We will try trial off simvastatin . Recheck in 3 months.

## 2011-04-15 NOTE — Assessment & Plan Note (Signed)
Resolved

## 2011-05-22 ENCOUNTER — Other Ambulatory Visit: Payer: Self-pay | Admitting: Family Medicine

## 2011-07-10 ENCOUNTER — Other Ambulatory Visit: Payer: Self-pay | Admitting: *Deleted

## 2011-07-10 MED ORDER — ZOLPIDEM TARTRATE 10 MG PO TABS: 5.0000 mg | ORAL_TABLET | Freq: Every evening | ORAL | Status: DC | PRN

## 2011-07-10 NOTE — Telephone Encounter (Signed)
Rx called to pharmacy

## 2011-09-09 ENCOUNTER — Other Ambulatory Visit: Payer: Self-pay | Admitting: *Deleted

## 2011-09-10 MED ORDER — ZOLPIDEM TARTRATE 10 MG PO TABS: 5.0000 mg | ORAL_TABLET | Freq: Every evening | ORAL | Status: DC | PRN

## 2011-09-10 NOTE — Telephone Encounter (Signed)
rx called to pharmacy 

## 2011-09-10 NOTE — Telephone Encounter (Signed)
Ok to refill #30, 0 refills 

## 2011-11-26 ENCOUNTER — Other Ambulatory Visit: Payer: Self-pay | Admitting: *Deleted

## 2011-11-26 MED ORDER — ZOLPIDEM TARTRATE 10 MG PO TABS: 5.0000 mg | ORAL_TABLET | Freq: Every evening | ORAL | Status: DC | PRN

## 2011-11-26 NOTE — Telephone Encounter (Signed)
Ok, #30, 0 refills 

## 2011-11-26 NOTE — Telephone Encounter (Signed)
RX CALLED TO PHARMACY  

## 2012-01-31 ENCOUNTER — Other Ambulatory Visit: Payer: Self-pay | Admitting: *Deleted

## 2012-01-31 NOTE — Telephone Encounter (Signed)
Faxed request from [facility], no last filled date given. 

## 2012-02-03 ENCOUNTER — Other Ambulatory Visit: Payer: Self-pay | Admitting: Family Medicine

## 2012-02-03 DIAGNOSIS — Z1231 Encounter for screening mammogram for malignant neoplasm of breast: Secondary | ICD-10-CM

## 2012-02-03 MED ORDER — ZOLPIDEM TARTRATE 10 MG PO TABS: 5.0000 mg | ORAL_TABLET | Freq: Every evening | ORAL | Status: DC | PRN

## 2012-02-03 NOTE — Telephone Encounter (Signed)
Medicine called to pharmacy. 

## 2012-02-17 ENCOUNTER — Ambulatory Visit
Admission: RE | Admit: 2012-02-17 | Discharge: 2012-02-17 | Disposition: A | Discharge status: Home / Self Care | Payer: BC Managed Care – PPO | Source: Ambulatory Visit | Attending: Family Medicine | Admitting: Family Medicine

## 2012-02-17 DIAGNOSIS — Z1231 Encounter for screening mammogram for malignant neoplasm of breast: Secondary | ICD-10-CM

## 2012-02-19 ENCOUNTER — Other Ambulatory Visit: Payer: Self-pay | Admitting: Family Medicine

## 2012-02-19 DIAGNOSIS — R928 Other abnormal and inconclusive findings on diagnostic imaging of breast: Secondary | ICD-10-CM

## 2012-02-24 ENCOUNTER — Ambulatory Visit
Admission: RE | Admit: 2012-02-24 | Discharge: 2012-02-24 | Disposition: A | Discharge status: Home / Self Care | Payer: BC Managed Care – PPO | Source: Ambulatory Visit | Attending: Family Medicine | Admitting: Family Medicine

## 2012-02-24 DIAGNOSIS — R928 Other abnormal and inconclusive findings on diagnostic imaging of breast: Secondary | ICD-10-CM

## 2012-02-24 LAB — HM MAMMOGRAPHY

## 2012-02-25 ENCOUNTER — Encounter: Payer: Self-pay | Admitting: *Deleted

## 2012-02-25 ENCOUNTER — Encounter: Payer: Self-pay | Admitting: Family Medicine

## 2012-04-10 ENCOUNTER — Other Ambulatory Visit: Payer: Self-pay | Admitting: *Deleted

## 2012-04-10 MED ORDER — ZOLPIDEM TARTRATE 10 MG PO TABS: 5.0000 mg | ORAL_TABLET | Freq: Every evening | ORAL | Status: DC | PRN

## 2012-04-20 ENCOUNTER — Other Ambulatory Visit: Payer: BC Managed Care – PPO

## 2012-04-24 ENCOUNTER — Encounter: Payer: BC Managed Care – PPO | Admitting: Family Medicine

## 2012-05-05 ENCOUNTER — Telehealth: Payer: Self-pay | Admitting: Family Medicine

## 2012-05-05 DIAGNOSIS — M899 Disorder of bone, unspecified: Secondary | ICD-10-CM

## 2012-05-05 DIAGNOSIS — I10 Essential (primary) hypertension: Secondary | ICD-10-CM

## 2012-05-05 DIAGNOSIS — M949 Disorder of cartilage, unspecified: Secondary | ICD-10-CM

## 2012-05-05 DIAGNOSIS — E7800 Pure hypercholesterolemia, unspecified: Secondary | ICD-10-CM

## 2012-05-05 NOTE — Telephone Encounter (Signed)
Message copied by Excell Seltzer on Tue May 05, 2012  2:35 PM ------      Message from: Alvina Chou      Created: Thu Apr 30, 2012 10:53 AM      Regarding: lab orders for Thursday 7.18.13       Patient is scheduled for CPX labs, please order future labs, Thanks , Camelia Eng

## 2012-05-07 ENCOUNTER — Other Ambulatory Visit (INDEPENDENT_AMBULATORY_CARE_PROVIDER_SITE_OTHER): Payer: BC Managed Care – PPO

## 2012-05-07 DIAGNOSIS — M899 Disorder of bone, unspecified: Secondary | ICD-10-CM

## 2012-05-07 DIAGNOSIS — E7800 Pure hypercholesterolemia, unspecified: Secondary | ICD-10-CM

## 2012-05-07 DIAGNOSIS — M949 Disorder of cartilage, unspecified: Secondary | ICD-10-CM

## 2012-05-07 LAB — LIPID PANEL: Cholesterol: 237 mg/dL — ABNORMAL HIGH (ref 0–200)

## 2012-05-07 LAB — COMPREHENSIVE METABOLIC PANEL
ALT: 27 U/L (ref 0–35)
Albumin: 4.3 g/dL (ref 3.5–5.2)
CO2: 26 mEq/L (ref 19–32)
Calcium: 9.2 mg/dL (ref 8.4–10.5)
Chloride: 107 mEq/L (ref 96–112)
GFR: 78.55 mL/min (ref >60.00)
Glucose, Bld: 99 mg/dL (ref 70–99)
Potassium: 4.7 mEq/L (ref 3.5–5.1)
Sodium: 142 mEq/L (ref 135–145)
Total Protein: 7.4 g/dL (ref 6.0–8.3)

## 2012-05-07 LAB — LDL CHOLESTEROL, DIRECT: Direct LDL: 153.4 mg/dL

## 2012-05-12 ENCOUNTER — Ambulatory Visit (INDEPENDENT_AMBULATORY_CARE_PROVIDER_SITE_OTHER): Payer: BC Managed Care – PPO | Admitting: Family Medicine

## 2012-05-12 ENCOUNTER — Encounter: Payer: Self-pay | Admitting: Family Medicine

## 2012-05-12 VITALS — BP 150/72 | HR 80 | Temp 98.3°F | Wt 122.0 lb

## 2012-05-12 DIAGNOSIS — F411 Generalized anxiety disorder: Secondary | ICD-10-CM

## 2012-05-12 DIAGNOSIS — Z01419 Encounter for gynecological examination (general) (routine) without abnormal findings: Secondary | ICD-10-CM

## 2012-05-12 DIAGNOSIS — E781 Pure hyperglyceridemia: Secondary | ICD-10-CM

## 2012-05-12 DIAGNOSIS — Z0000 Encounter for general adult medical examination without abnormal findings: Secondary | ICD-10-CM

## 2012-05-12 DIAGNOSIS — I10 Essential (primary) hypertension: Secondary | ICD-10-CM

## 2012-05-12 DIAGNOSIS — E7800 Pure hypercholesterolemia, unspecified: Secondary | ICD-10-CM

## 2012-05-12 MED ORDER — SIMVASTATIN 20 MG PO TABS: 20.0000 mg | ORAL_TABLET | Freq: Every evening | ORAL | Status: DC

## 2012-05-12 NOTE — Progress Notes (Signed)
Subjective:    Patient ID: Martha Hayes, female    DOB: September 22, 1951, 61 y.o.   MRN: [id number]  HPI   The patient's preventative maintenance and recommended screening tests for an annual wellness exam were reviewed in full today. Brought up to date unless services declined.  Counselled on the importance of diet, exercise, and its role in overall health and mortality. The patient's FH and SH was reviewed, including their home life, tobacco status, and drug and alcohol status.    Hypertension:  On no med.Marland Kitchen Poorly controlled today. Previously well controlled on lisinopril/HCTZ.  Using medication without problems or lightheadedness: None Chest pain with exertion: None Edema:None Short of breath:None Average home BPs: 135/70 Other issues:  Elevated Cholesterol:  On no medicaiton, previously on zocor .Marland Kitchen LDL not at goal <130 Lab Results  Component Value Date   CHOL 237* 05/07/2012   HDL 46.80 05/07/2012   LDLCALC 97 10/29/2010   LDLDIRECT 153.4 05/07/2012   TRIG 137.0 05/07/2012   CHOLHDL 5 05/07/2012   Diet compliance: Good, healthy eating. Has lost 18 lbs since last year.  HAs been working hard on low cholesterol diet.  Exercise: None Other complaints:   Anxiety: On zoloft 100 mg , uses ambien for insomnia. Plans to retire and will come off medication then.   Review of Systems  Constitutional: Negative for fever, fatigue and unexpected weight change.  HENT: Negative for ear pain, congestion, sore throat, sneezing, trouble swallowing and sinus pressure.   Eyes: Negative for pain and itching.  Respiratory: Negative for cough, shortness of breath and wheezing.   Cardiovascular: Negative for chest pain, palpitations and leg swelling.  Gastrointestinal: Negative for nausea, abdominal pain, diarrhea, constipation and blood in stool.  Genitourinary: Negative for dysuria, hematuria, vaginal discharge, difficulty urinating and menstrual problem.  Skin: Negative for rash.  Neurological:  Negative for syncope, weakness, light-headedness, numbness and headaches.  Psychiatric/Behavioral: Negative for confusion and dysphoric mood. The patient is not nervous/anxious.        Objective:   Physical Exam  Constitutional: Vital signs are normal. She appears well-developed and well-nourished. She is cooperative.  Non-toxic appearance. She does not appear ill. No distress.  HENT:  Head: Normocephalic.  Right Ear: Hearing, tympanic membrane, external ear and ear canal normal.  Left Ear: Hearing, tympanic membrane, external ear and ear canal normal.  Nose: Nose normal.  Eyes: Conjunctivae, EOM and lids are normal. Pupils are equal, round, and reactive to light. No foreign bodies found.  Neck: Trachea normal and normal range of motion. Neck supple. Carotid bruit is not present. No mass and no thyromegaly present.  Cardiovascular: Normal rate, regular rhythm, S1 normal, S2 normal, normal heart sounds and intact distal pulses.  Exam reveals no gallop.   No murmur heard. Pulmonary/Chest: Effort normal and breath sounds normal. No respiratory distress. She has no wheezes. She has no rhonchi. She has no rales.  Abdominal: Soft. Normal appearance and bowel sounds are normal. She exhibits no distension, no fluid wave, no abdominal bruit and no mass. There is no hepatosplenomegaly. There is no tenderness. There is no rebound, no guarding and no CVA tenderness. No hernia.  Genitourinary: Vagina normal and uterus normal. No breast swelling, tenderness, discharge or bleeding. Pelvic exam was performed with patient prone. There is no rash, tenderness or lesion on the right labia. There is no rash, tenderness or lesion on the left labia. Uterus is not enlarged and not tender. Right adnexum displays no mass, no tenderness and no fullness.  Left adnexum displays no mass, no tenderness and no fullness.  Lymphadenopathy:    She has no cervical adenopathy.    She has no axillary adenopathy.  Neurological: She  is alert. She has normal strength. No cranial nerve deficit or sensory deficit.  Skin: Skin is warm, dry and intact. No rash noted.  Psychiatric: Her speech is normal and behavior is normal. Judgment normal. Her mood appears not anxious. Cognition and memory are normal. She does not exhibit a depressed mood.          Assessment & Plan:  The patient's preventative maintenance and recommended screening tests for an annual wellness exam were reviewed in full today. Brought up to date unless services declined.  Counselled on the importance of diet, exercise, and its role in overall health and mortality. The patient's FH and SH was reviewed, including their home life, tobacco status, and drug and alcohol status.   Vaccines:Uptodate with Td and zoster  PAP/DVE: partial hysterectomy, yearly DVE. 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 Plan repeat US in 6 months.  Colonoscopy: Dr. Ewing Schlein Date: 02/15/2008  Next Due: 02/2018  Results: Diverticulosis  DEXA: 2010.. Osteopenia.Marland Kitchen Has not been taking ca and vit D. Does not want to do DEXA this year.

## 2012-05-12 NOTE — Assessment & Plan Note (Signed)
Stable control on zoloft. No SI, no HI.

## 2012-05-12 NOTE — Patient Instructions (Addendum)
Follow BP at home.Marland Kitchen Restart lisinopril/HCTZ if running >140/90. Restart simvastatin 20 mg daily. Start routine exercise regimen 3-5 days a week... Could help with cholesterol!!! Return for fasting labs in 3 months.

## 2012-05-12 NOTE — Assessment & Plan Note (Signed)
Inadequate control off med.Marland Kitchen Restart simvastatin 20 mg daily. Recheck in 3 months.

## 2012-05-12 NOTE — Assessment & Plan Note (Signed)
Folow at home, restart lisinopril HCTZ if greater than 140/90. Encouraged exercise, weight loss, healthy eating habits.

## 2012-05-14 ENCOUNTER — Other Ambulatory Visit: Payer: Self-pay | Admitting: Family Medicine

## 2012-06-19 ENCOUNTER — Other Ambulatory Visit: Payer: Self-pay

## 2012-06-19 MED ORDER — ZOLPIDEM TARTRATE 10 MG PO TABS: 5.0000 mg | ORAL_TABLET | Freq: Every evening | ORAL | Status: DC | PRN

## 2012-06-19 NOTE — Telephone Encounter (Signed)
Medication phoned to [facility] pharmacy as instructed.  

## 2012-06-19 NOTE — Telephone Encounter (Signed)
[facility] faxed request [email or web address] filled 05/20/12.

## 2012-08-05 ENCOUNTER — Other Ambulatory Visit: Payer: Self-pay | Admitting: Family Medicine

## 2012-08-05 DIAGNOSIS — N63 Unspecified lump in unspecified breast: Secondary | ICD-10-CM

## 2012-08-27 ENCOUNTER — Encounter: Payer: Self-pay | Admitting: Family Medicine

## 2012-08-27 ENCOUNTER — Ambulatory Visit
Admission: RE | Admit: 2012-08-27 | Discharge: 2012-08-27 | Disposition: A | Discharge status: Home / Self Care | Payer: BC Managed Care – PPO | Source: Ambulatory Visit | Attending: Family Medicine | Admitting: Family Medicine

## 2012-08-27 DIAGNOSIS — N63 Unspecified lump in unspecified breast: Secondary | ICD-10-CM

## 2012-10-20 ENCOUNTER — Other Ambulatory Visit: Payer: Self-pay | Admitting: Family Medicine

## 2012-10-20 MED ORDER — ZOLPIDEM TARTRATE 10 MG PO TABS: 5.0000 mg | ORAL_TABLET | Freq: Every evening | ORAL | Status: DC | PRN

## 2012-10-20 NOTE — Addendum Note (Signed)
Addended by: Kerby Nora E on: 10/20/2012 05:11 PM   Modules accepted: Orders

## 2012-10-20 NOTE — Telephone Encounter (Signed)
Medication phoned to [facility] pharmacy as instructed.  

## 2013-02-18 ENCOUNTER — Other Ambulatory Visit: Payer: Self-pay | Admitting: Family Medicine

## 2013-02-18 DIAGNOSIS — N63 Unspecified lump in unspecified breast: Secondary | ICD-10-CM

## 2013-03-10 ENCOUNTER — Ambulatory Visit
Admission: RE | Admit: 2013-03-10 | Discharge: 2013-03-10 | Disposition: A | Discharge status: Home / Self Care | Payer: BC Managed Care – PPO | Source: Ambulatory Visit | Attending: Family Medicine | Admitting: Family Medicine

## 2013-03-10 DIAGNOSIS — N63 Unspecified lump in unspecified breast: Secondary | ICD-10-CM

## 2013-05-05 ENCOUNTER — Encounter: Payer: Self-pay | Admitting: Radiology

## 2013-05-06 ENCOUNTER — Other Ambulatory Visit (INDEPENDENT_AMBULATORY_CARE_PROVIDER_SITE_OTHER): Payer: BC Managed Care – PPO

## 2013-05-06 ENCOUNTER — Telehealth: Payer: Self-pay | Admitting: Family Medicine

## 2013-05-06 DIAGNOSIS — E7800 Pure hypercholesterolemia, unspecified: Secondary | ICD-10-CM

## 2013-05-06 DIAGNOSIS — I10 Essential (primary) hypertension: Secondary | ICD-10-CM

## 2013-05-06 DIAGNOSIS — E781 Pure hyperglyceridemia: Secondary | ICD-10-CM

## 2013-05-06 DIAGNOSIS — M899 Disorder of bone, unspecified: Secondary | ICD-10-CM

## 2013-05-06 DIAGNOSIS — R945 Abnormal results of liver function studies: Secondary | ICD-10-CM

## 2013-05-06 NOTE — Telephone Encounter (Signed)
Message copied by Excell Seltzer on Thu May 06, 2013  8:18 AM ------      Message from: Alvina Chou      Created: Mon May 03, 2013 10:48 AM      Regarding: Lab orders for Thursday, 7.17.14       Patient is scheduled for CPX labs, please order future labs, Thanks , Terri       ------

## 2013-05-13 ENCOUNTER — Encounter: Payer: BC Managed Care – PPO | Admitting: Family Medicine

## 2013-05-18 ENCOUNTER — Encounter: Payer: Self-pay | Admitting: Family Medicine

## 2013-05-18 ENCOUNTER — Ambulatory Visit (INDEPENDENT_AMBULATORY_CARE_PROVIDER_SITE_OTHER): Payer: BC Managed Care – PPO | Admitting: Family Medicine

## 2013-05-18 VITALS — BP 118/78 | HR 75 | Temp 98.0°F | Ht 62.0 in | Wt 123.2 lb

## 2013-05-18 DIAGNOSIS — E7800 Pure hypercholesterolemia, unspecified: Secondary | ICD-10-CM

## 2013-05-18 DIAGNOSIS — Z0000 Encounter for general adult medical examination without abnormal findings: Secondary | ICD-10-CM

## 2013-05-18 DIAGNOSIS — I10 Essential (primary) hypertension: Secondary | ICD-10-CM

## 2013-05-18 DIAGNOSIS — E781 Pure hyperglyceridemia: Secondary | ICD-10-CM

## 2013-05-18 NOTE — Patient Instructions (Signed)
Stop at lab on your way out.   

## 2013-05-18 NOTE — Addendum Note (Signed)
Addended by: Alvina Chou on: 05/18/2013 11:19 AM   Modules accepted: Orders

## 2013-05-18 NOTE — Assessment & Plan Note (Signed)
Well controlled on no med. 

## 2013-05-18 NOTE — Progress Notes (Addendum)
The patient's preventative maintenance and recommended screening tests for an annual wellness exam were reviewed in full today.  Brought up to date unless services declined.  Counselled on the importance of diet, exercise, and its role in overall health and mortality.  The patient's FH and SH was reviewed, including their home life, tobacco status, and drug and alcohol status.   Hypertension: Well controlled on no medication. Using medication without problems or lightheadedness: None  Chest pain with exertion: None  Edema:None  Short of breath:None  Average home BPs: 130/80 Other issues:   Elevated Cholesterol: Back on simvastatin Goal LDL <130.  Lab Results  Component Value Date   CHOL 237* 05/07/2012   HDL 46.80 05/07/2012   LDLCALC 97 10/29/2010   LDLDIRECT 153.4 05/07/2012   TRIG 137.0 05/07/2012   CHOLHDL 5 05/07/2012   Diet compliance: Good, healthy eating.  Wt Readings from Last 3 Encounters:  05/18/13 123 lb 4 oz (55.906 kg)  05/12/12 122 lb (55.339 kg)  04/15/11 130 lb (58.968 kg)  Has been working hard on low cholesterol diet.  Exercise: Water aerobics 3 tinmes a week and walking. Other complaints:   Anxiety: She has been able to stop zoloft and ambien. She is doing very well now that she is retired.     Review of Systems  Constitutional: Negative for fever, fatigue and unexpected weight change.  HENT: Negative for ear pain, congestion, sore throat, sneezing, trouble swallowing and sinus pressure.  Eyes: Negative for pain and itching.  Respiratory: Negative for cough, shortness of breath and wheezing.  Cardiovascular: Negative for chest pain, palpitations and leg swelling.  Gastrointestinal: Negative for nausea, abdominal pain, diarrhea, constipation and blood in stool.  Genitourinary: Negative for dysuria, hematuria, vaginal discharge, difficulty urinating and menstrual problem.  Skin: Negative for rash.  Neurological: Negative for syncope, weakness, light-headedness,  numbness and headaches.  Psychiatric/Behavioral: Negative for confusion and dysphoric mood. The patient is not nervous/anxious.  Objective:   Physical Exam  Constitutional: Vital signs are normal. She appears well-developed and well-nourished. She is cooperative. Non-toxic appearance. She does not appear ill. No distress.  HENT:  Head: Normocephalic.  Right Ear: Hearing, tympanic membrane, external ear and ear canal normal.  Left Ear: Hearing, tympanic membrane, external ear and ear canal normal.  Nose: Nose normal.  Eyes: Conjunctivae, EOM and lids are normal. Pupils are equal, round, and reactive to light. No foreign bodies found.  Neck: Trachea normal and normal range of motion. Neck supple. Carotid bruit is not present. No mass and no thyromegaly present.  Cardiovascular: Normal rate, regular rhythm, S1 normal, S2 normal, normal heart sounds and intact distal pulses. Exam reveals no gallop.  No murmur heard.  Pulmonary/Chest: Effort normal and breath sounds normal. No respiratory distress. She has no wheezes. She has no rhonchi. She has no rales.  Abdominal: Soft. Normal appearance and bowel sounds are normal. She exhibits no distension, no fluid wave, no abdominal bruit and no mass. There is no hepatosplenomegaly. There is no tenderness. There is no rebound, no guarding and no CVA tenderness. No hernia.  Genitourinary: Vagina normal and uterus normal. No breast swelling, tenderness, discharge or bleeding. Pelvic exam was performed with patient prone. There is no rash, tenderness or lesion on the right labia. There is no rash, tenderness or lesion on the left labia. Uterus is not enlarged and not tender. Right adnexum displays no mass, no tenderness and no fullness. Left adnexum displays no mass, no tenderness and no fullness.  Lymphadenopathy:  She has no cervical adenopathy.  She has no axillary adenopathy.  Neurological: She is alert. She has normal strength. No cranial nerve deficit or  sensory deficit.  Skin: Skin is warm, dry and intact. No rash noted.  Psychiatric: Her speech is normal and behavior is normal. Judgment normal. Her mood appears not anxious. Cognition and memory are normal. She does not exhibit a depressed mood.  Assessment & Plan:   The patient's preventative maintenance and recommended screening tests for an annual wellness exam were reviewed in full today.  Brought up to date unless services declined.  Counselled on the importance of diet, exercise, and its role in overall health and mortality.  The patient's FH and SH was reviewed, including their home life, tobacco status, and drug and alcohol status.   Vaccines:Uptodate with Td and zoster  PAP/DVE: partial hysterectomy, yearly DVE.  Mammo: 5/14 fibroadenoma.. Plan repeat US\diagnostic mammogram in 1 year  Colonoscopy: Dr. Ewing Schlein  Date: 02/15/2008  Next Due: 02/2018  Results: Diverticulosis  DEXA: 2010.. Osteopenia.Marland Kitchen Has not been taking ca and vit D. Does not EVER want to do DEXA, would not use meds to treat.

## 2013-05-19 ENCOUNTER — Encounter: Payer: Self-pay | Admitting: Family Medicine

## 2013-05-20 ENCOUNTER — Encounter: Payer: Self-pay | Admitting: Family Medicine

## 2013-06-07 ENCOUNTER — Other Ambulatory Visit: Payer: Self-pay | Admitting: Family Medicine

## 2013-06-07 NOTE — Telephone Encounter (Signed)
Received refill request electronically. See last lipid panel results. Please confirm correct dose of medication.

## 2013-06-08 NOTE — Telephone Encounter (Signed)
Pt refused to increase, wants to stay at 20 mg daily.

## 2013-08-26 ENCOUNTER — Other Ambulatory Visit: Payer: Self-pay

## 2014-01-13 ENCOUNTER — Ambulatory Visit (INDEPENDENT_AMBULATORY_CARE_PROVIDER_SITE_OTHER): Payer: BC Managed Care – PPO | Admitting: Family Medicine

## 2014-01-13 ENCOUNTER — Other Ambulatory Visit: Payer: BC Managed Care – PPO

## 2014-01-13 ENCOUNTER — Encounter: Payer: Self-pay | Admitting: Family Medicine

## 2014-01-13 ENCOUNTER — Telehealth: Payer: Self-pay | Admitting: Family Medicine

## 2014-01-13 VITALS — BP 140/76 | HR 74 | Temp 98.1°F | Ht 62.0 in | Wt 127.5 lb

## 2014-01-13 DIAGNOSIS — I10 Essential (primary) hypertension: Secondary | ICD-10-CM

## 2014-01-13 MED ORDER — LISINOPRIL-HYDROCHLOROTHIAZIDE 10-12.5 MG PO TABS: 1.0000 | ORAL_TABLET | Freq: Every day | ORAL | Status: DC

## 2014-01-13 NOTE — Telephone Encounter (Signed)
Relevant patient education assigned to patient using Emmi. ° °

## 2014-01-13 NOTE — Patient Instructions (Signed)
Follow  BP at home.. Goal < 140/90. Schedule fasting labs when able.

## 2014-01-13 NOTE — Progress Notes (Signed)
   Subjective:    Patient ID: Martha Hayes, female    DOB: 1951/09/19, 63 y.o.   MRN: [id number]  Hypertension Pertinent negatives include no chest pain, palpitations or shortness of breath.    63 year old female with history of mild HTN on no medication at this point returns for BP check.  She has been noting [id number] systolic at home in last few months. No chest pain, no SOB, no lightheadedness.  No headaches.  She was on lisinopril HCTZ.  BP Readings from Last 3 Encounters:  01/13/14 140/76  05/18/13 118/78  05/12/12 150/72      Review of Systems  Constitutional: Negative for fever and fatigue.  HENT: Negative for ear pain.   Eyes: Negative for pain.  Respiratory: Negative for chest tightness and shortness of breath.   Cardiovascular: Negative for chest pain, palpitations and leg swelling.  Gastrointestinal: Negative for abdominal pain.  Genitourinary: Negative for dysuria.       Objective   Physical Exam  Constitutional: Vital signs are normal. She appears well-developed and well-nourished. She is cooperative.  Non-toxic appearance. She does not appear ill. No distress.  HENT:  Head: Normocephalic.  Right Ear: Hearing, tympanic membrane, external ear and ear canal normal. Tympanic membrane is not erythematous, not retracted and not bulging.  Left Ear: Hearing, tympanic membrane, external ear and ear canal normal. Tympanic membrane is not erythematous, not retracted and not bulging.  Nose: No mucosal edema or rhinorrhea. Right sinus exhibits no maxillary sinus tenderness and no frontal sinus tenderness. Left sinus exhibits no maxillary sinus tenderness and no frontal sinus tenderness.  Mouth/Throat: Uvula is midline, oropharynx is clear and moist and mucous membranes are normal.  Eyes: Conjunctivae, EOM and lids are normal. Pupils are equal, round, and reactive to light. Lids are everted and swept, no foreign bodies found.  Neck: Trachea normal and normal range of motion.  Neck supple. Carotid bruit is not present. No mass and no thyromegaly present.  Cardiovascular: Normal rate, regular rhythm, S1 normal, S2 normal, normal heart sounds, intact distal pulses and normal pulses.  Exam reveals no gallop and no friction rub.   No murmur heard. Pulmonary/Chest: Effort normal and breath sounds normal. Not tachypneic. No respiratory distress. She has no decreased breath sounds. She has no wheezes. She has no rhonchi. She has no rales.  Abdominal: Soft. Normal appearance and bowel sounds are normal. There is no tenderness.  Neurological: She is alert.  Skin: Skin is warm, dry and intact. No rash noted.  Psychiatric: Her speech is normal and behavior is normal. Judgment and thought content normal. Her mood appears not anxious. Cognition and memory are normal. She does not exhibit a depressed mood.          Assessment & Plan:

## 2014-01-13 NOTE — Progress Notes (Signed)
Pre visit review using our clinic review tool, if applicable. No additional management support is needed unless otherwise documented below in the visit note. 

## 2014-01-14 ENCOUNTER — Other Ambulatory Visit (INDEPENDENT_AMBULATORY_CARE_PROVIDER_SITE_OTHER): Payer: BC Managed Care – PPO

## 2014-01-14 DIAGNOSIS — I10 Essential (primary) hypertension: Secondary | ICD-10-CM

## 2014-02-22 ENCOUNTER — Other Ambulatory Visit: Payer: Self-pay | Admitting: Family Medicine

## 2014-02-22 DIAGNOSIS — N6310 Unspecified lump in the right breast, unspecified quadrant: Secondary | ICD-10-CM

## 2014-02-23 ENCOUNTER — Other Ambulatory Visit: Payer: Self-pay | Admitting: Family Medicine

## 2014-02-23 ENCOUNTER — Other Ambulatory Visit: Payer: Self-pay

## 2014-02-23 DIAGNOSIS — N6310 Unspecified lump in the right breast, unspecified quadrant: Secondary | ICD-10-CM

## 2014-03-17 ENCOUNTER — Ambulatory Visit
Admission: RE | Admit: 2014-03-17 | Discharge: 2014-03-17 | Disposition: A | Discharge status: Home / Self Care | Payer: BC Managed Care – PPO | Source: Ambulatory Visit | Attending: Family Medicine | Admitting: Family Medicine

## 2014-03-17 DIAGNOSIS — N6310 Unspecified lump in the right breast, unspecified quadrant: Secondary | ICD-10-CM

## 2014-06-24 ENCOUNTER — Encounter: Payer: BC Managed Care – PPO | Admitting: Family Medicine

## 2014-06-28 ENCOUNTER — Encounter: Payer: Self-pay | Admitting: Family Medicine

## 2014-06-28 ENCOUNTER — Ambulatory Visit (INDEPENDENT_AMBULATORY_CARE_PROVIDER_SITE_OTHER): Payer: BC Managed Care – PPO | Admitting: Family Medicine

## 2014-06-28 VITALS — BP 136/80 | HR 81 | Temp 97.7°F | Ht 62.0 in | Wt 124.8 lb

## 2014-06-28 DIAGNOSIS — Z0000 Encounter for general adult medical examination without abnormal findings: Secondary | ICD-10-CM

## 2014-06-28 DIAGNOSIS — E7800 Pure hypercholesterolemia, unspecified: Secondary | ICD-10-CM

## 2014-06-28 DIAGNOSIS — I10 Essential (primary) hypertension: Secondary | ICD-10-CM

## 2014-06-28 NOTE — Progress Notes (Signed)
Pre visit review using our clinic review tool, if applicable. No additional management support is needed unless otherwise documented below in the visit note. 

## 2014-06-28 NOTE — Assessment & Plan Note (Signed)
LDL at goal < 130 on  simvastatin daily.

## 2014-06-28 NOTE — Patient Instructions (Addendum)
Get flu vaccine when able. Keep up great work on healthy eating and regular exercise.   Food Choices to Lower Your Triglycerides  Triglycerides are a type of fat in your blood. High levels of triglycerides can increase the risk of heart disease and stroke. If your triglyceride levels are high, the foods you eat and your eating habits are very important. Choosing the right foods can help lower your triglycerides.  WHAT GENERAL GUIDELINES DO I NEED TO FOLLOW?  Lose weight if you are overweight.   Limit or avoid alcohol.   Fill one half of your plate with vegetables and green salads.   Limit fruit to two servings a day. Choose fruit instead of juice.   Make one fourth of your plate whole grains. Look for the word "whole" as the first word in the ingredient list.  Fill one fourth of your plate with lean protein foods.  Enjoy fatty fish (such as salmon, mackerel, sardines, and tuna) three times a week.   Choose healthy fats.   Limit foods high in starch and sugar.  Eat more home-cooked food and less restaurant, buffet, and fast food.  Limit fried foods.  Cook foods using methods other than frying.  Limit saturated fats.  Check ingredient lists to avoid foods with partially hydrogenated oils (trans fats) in them. WHAT FOODS CAN I EAT?  Grains Whole grains, such as whole wheat or whole grain breads, crackers, cereals, and pasta. Unsweetened oatmeal, bulgur, barley, quinoa, or brown rice. Corn or whole wheat flour tortillas.  Vegetables Fresh or frozen vegetables (raw, steamed, roasted, or grilled). Green salads. Fruits All fresh, canned (in natural juice), or frozen fruits. Meat and Other Protein Products Ground beef (85% or leaner), grass-fed beef, or beef trimmed of fat. Skinless chicken or [place]. Ground chicken or [place]. Pork trimmed of fat. All fish and seafood. Eggs. Dried beans, peas, or lentils. Unsalted nuts or seeds. Unsalted canned or dry beans. Dairy Low-fat  dairy products, such as skim or 1% milk, 2% or reduced-fat cheeses, low-fat ricotta or cottage cheese, or plain low-fat yogurt. Fats and Oils Tub margarines without trans fats. Light or reduced-fat mayonnaise and salad dressings. Avocado. Safflower, olive, or canola oils. Natural peanut or almond butter. The items listed above may not be a complete list of recommended foods or beverages. Contact your dietitian for more options. WHAT FOODS ARE NOT RECOMMENDED?  Grains White bread. White pasta. White rice. Cornbread. Bagels, pastries, and croissants. Crackers that contain trans fat. Vegetables White potatoes. Corn. Creamed or fried vegetables. Vegetables in a cheese sauce. Fruits Dried fruits. Canned fruit in light or heavy syrup. Fruit juice. Meat and Other Protein Products Fatty cuts of meat. Ribs, chicken wings, bacon, sausage, bologna, salami, chitterlings, fatback, hot dogs, bratwurst, and packaged luncheon meats. Dairy Whole or 2% milk, cream, half-and-half, and cream cheese. Whole-fat or sweetened yogurt. Full-fat cheeses. Nondairy creamers and whipped toppings. Processed cheese, cheese spreads, or cheese curds. Sweets and Desserts Corn syrup, sugars, honey, and molasses. Candy. Jam and jelly. Syrup. Sweetened cereals. Cookies, pies, cakes, donuts, muffins, and ice cream. Fats and Oils Butter, stick margarine, lard, shortening, ghee, or bacon fat. Coconut, palm kernel, or palm oils. Beverages Alcohol. Sweetened drinks (such as sodas, lemonade, and fruit drinks or punches). The items listed above may not be a complete list of foods and beverages to avoid. Contact your dietitian for more information. Document Released: 07/25/2004 Document Revised: 10/12/2013 Document Reviewed: 08/11/2013 [employer] Patient Information 2015 [place], [place]. This information is  not intended to replace advice given to you by your health care provider. Make sure you discuss any questions you have with your health  care provider.

## 2014-06-28 NOTE — Progress Notes (Signed)
The patient is here for annual wellness exam and preventative care.    Hypertension: Well controlled on lisinopril  HCTZ BP Readings from Last 3 Encounters:  06/28/14 136/80  01/13/14 140/76  05/18/13 118/78  Using medication without problems or lightheadedness: None  Chest pain with exertion: None  Edema:None  Short of breath:None  Average home BPs: 130/80  Other issues:   Elevated Cholesterol: Back on simvastatin Goal LDL <130. LDL at work 87, HDL 48, trig 168.  Diet compliance: Good, healthy eating.  Wt Readings from Last 3 Encounters:  06/28/14 124 lb 12 oz (56.586 kg)  01/13/14 127 lb 8 oz (57.834 kg)  05/18/13 123 lb 4 oz (55.906 kg)  Has been working hard on low cholesterol diet.  Exercise: Water aerobics 3 times a week and walking.  Other complaints:    Anxiety: She has been able to remain off zoloft and ambien. She is doing very well now that she is retired.   Review of Systems  Constitutional: Negative for fever, fatigue and unexpected weight change.  HENT: Negative for ear pain, congestion, sore throat, sneezing, trouble swallowing and sinus pressure.  Eyes: Negative for pain and itching.  Respiratory: Negative for cough, shortness of breath and wheezing.  Cardiovascular: Negative for chest pain, palpitations and leg swelling.  Gastrointestinal: Negative for nausea, abdominal pain, diarrhea, constipation and blood in stool.  Genitourinary: Negative for dysuria, hematuria, vaginal discharge, difficulty urinating and menstrual problem.  Skin: Negative for rash.  Neurological: Negative for syncope, weakness, light-headedness, numbness and headaches.  Psychiatric/Behavioral: Negative for confusion and dysphoric mood. The patient is not nervous/anxious.  Objective:   Physical Exam  Constitutional: Vital signs are normal. She appears well-developed and well-nourished. She is cooperative. Non-toxic appearance. She does not appear ill. No distress.  HENT:  Head:  Normocephalic.  Right Ear: Hearing, tympanic membrane, external ear and ear canal normal.  Left Ear: Hearing, tympanic membrane, external ear and ear canal normal.  Nose: Nose normal.  Eyes: Conjunctivae, EOM and lids are normal. Pupils are equal, round, and reactive to light. No foreign bodies found.  Neck: Trachea normal and normal range of motion. Neck supple. Carotid bruit is not present. No mass and no thyromegaly present.  Cardiovascular: Normal rate, regular rhythm, S1 normal, S2 normal, normal heart sounds and intact distal pulses. Exam reveals no gallop.  No murmur heard.  Pulmonary/Chest: Effort normal and breath sounds normal. No respiratory distress. She has no wheezes. She has no rhonchi. She has no rales.  Abdominal: Soft. Normal appearance and bowel sounds are normal. She exhibits no distension, no fluid wave, no abdominal bruit and no mass. There is no hepatosplenomegaly. There is no tenderness. There is no rebound, no guarding and no CVA tenderness. No hernia.  Genitourinary: Vagina normal and uterus normal. No breast swelling, tenderness, discharge or bleeding. There is no rash, tenderness or lesion on the right labia. There is no rash, tenderness or lesion on the left labia. Uterus is not enlarged and not tender. Right adnexum displays no mass, no tenderness and no fullness. Left adnexum displays no mass, no tenderness and no fullness.  NO PAP PERFORMED. Lymphadenopathy:  She has no cervical adenopathy.  She has no axillary adenopathy.  Neurological: She is alert. She has normal strength. No cranial nerve deficit or sensory deficit.  Skin: Skin is warm, dry and intact. No rash noted.  Psychiatric: Her speech is normal and behavior is normal. Judgment normal. Her mood appears not anxious. Cognition and memory are  normal. She does not exhibit a depressed mood.  Assessment & Plan:   The patient's preventative maintenance and recommended screening tests for an annual wellness exam  were reviewed in full today.  Brought up to date unless services declined.  Counselled on the importance of diet, exercise, and its role in overall health and mortality.  The patient's FH and SH was reviewed, including their home life, tobacco status, and drug and alcohol status.   Vaccines:Uptodate with Td and zoster, plans to get a flu vaccine at pharmacy. PAP/DVE: partial hysterectomy, yearly DVE.  Mammo: stable 02/2014 Colonoscopy: Dr. Watt Climes Date: 02/15/2008, Next Due: 02/2018  Results: Diverticulosis  DEXA: 2010.. Osteopenia.Marland Kitchen Has not been taking ca and vit D. Does not EVER want to do DEXA, would not use meds to treat.

## 2014-09-21 ENCOUNTER — Other Ambulatory Visit: Payer: Self-pay | Admitting: Family Medicine

## 2015-02-14 ENCOUNTER — Other Ambulatory Visit: Payer: Self-pay

## 2015-02-14 DIAGNOSIS — Z1231 Encounter for screening mammogram for malignant neoplasm of breast: Secondary | ICD-10-CM

## 2015-02-17 ENCOUNTER — Other Ambulatory Visit: Payer: Self-pay | Admitting: Family Medicine

## 2015-03-21 ENCOUNTER — Ambulatory Visit
Admission: RE | Admit: 2015-03-21 | Discharge: 2015-03-21 | Disposition: A | Discharge status: Home / Self Care | Payer: BLUE CROSS/BLUE SHIELD | Source: Ambulatory Visit

## 2015-03-21 DIAGNOSIS — Z1231 Encounter for screening mammogram for malignant neoplasm of breast: Secondary | ICD-10-CM

## 2015-04-11 ENCOUNTER — Encounter: Payer: Self-pay | Admitting: Family Medicine

## 2015-04-11 ENCOUNTER — Ambulatory Visit (INDEPENDENT_AMBULATORY_CARE_PROVIDER_SITE_OTHER): Payer: BLUE CROSS/BLUE SHIELD | Admitting: Family Medicine

## 2015-04-11 VITALS — BP 122/72 | HR 73 | Temp 98.1°F | Ht 62.0 in | Wt 122.8 lb

## 2015-04-11 DIAGNOSIS — H8110 Benign paroxysmal vertigo, unspecified ear: Secondary | ICD-10-CM | POA: Diagnosis not present

## 2015-04-11 NOTE — Patient Instructions (Signed)
Can use 25 mg meclizine three times a day for vertigo if no sedation. Start home vertigo exercises. Use OTC cerumenex for left ear wax.  Do not drive. If vertigo not improving in 2 week, call for referral.

## 2015-04-11 NOTE — Progress Notes (Signed)
Subjective:    Patient ID: Martha Hayes, female    DOB: [birth year], 64 y.o.   MRN: [id number]  HPI  64 year old female with history of hypertension presents with new onset vertigo.  She reports that she has been having room spinning begin 4 days ago when she was lying in bed and rolled head to side. Since then any change in position triggers it. No nausea, no vomting. No headache.  No worse or better.   She has never had vertigo in past.   No recent meds. No had injury.  No hearing loss.  No new vision changes, no neuro changes. No weakness, no numbness.   She has tried meclizine twice a day, helps some.    Social History /Family History/Past Medical History reviewed and updated if needed. No family history of Meniere's or vertigo issues.  Review of Systems  Constitutional: Negative for fever and fatigue.  HENT: Negative for ear pain.        Occ few weeks ago, left ear feeling clogged in AM then opens up after a little while.   Eyes: Negative for pain.  Respiratory: Negative for chest tightness and shortness of breath.   Cardiovascular: Negative for chest pain, palpitations and leg swelling.  Gastrointestinal: Negative for abdominal pain.  Genitourinary: Negative for dysuria.       Objective:   Physical Exam  Constitutional: She is oriented to person, place, and time. Vital signs are normal. She appears well-developed and well-nourished. She is cooperative.  Non-toxic appearance. She does not appear ill. No distress.  HENT:  Head: Normocephalic.  Right Ear: Hearing, tympanic membrane, external ear and ear canal normal. Tympanic membrane is not erythematous, not retracted and not bulging.  Left Ear: Hearing, tympanic membrane, external ear and ear canal normal. Tympanic membrane is not erythematous, not retracted and not bulging.  Nose: No mucosal edema or rhinorrhea. Right sinus exhibits no maxillary sinus tenderness and no frontal sinus tenderness. Left sinus exhibits no  maxillary sinus tenderness and no frontal sinus tenderness.  Mouth/Throat: Uvula is midline, oropharynx is clear and moist and mucous membranes are normal.  Eyes: Conjunctivae, EOM and lids are normal. Pupils are equal, round, and reactive to light. Lids are everted and swept, no foreign bodies found.  Neck: Trachea normal and normal range of motion. Neck supple. Carotid bruit is not present. No thyroid mass and no thyromegaly present.  Cardiovascular: Normal rate, regular rhythm, S1 normal, S2 normal, normal heart sounds, intact distal pulses and normal pulses.  Exam reveals no gallop and no friction rub.   No murmur heard. Pulmonary/Chest: Effort normal and breath sounds normal. No tachypnea. No respiratory distress. She has no decreased breath sounds. She has no wheezes. She has no rhonchi. She has no rales.  Abdominal: Soft. Normal appearance and bowel sounds are normal. There is no tenderness.  Neurological: She is alert and oriented to person, place, and time. She has normal strength and normal reflexes. No cranial nerve deficit or sensory deficit. She exhibits normal muscle tone. She displays a negative Romberg sign. Coordination and gait normal. GCS eye subscore is 4. GCS verbal subscore is 5. GCS motor subscore is 6.  Nml cerebellar exam   No papilledema  Skin: Skin is warm, dry and intact. No rash noted.  Psychiatric: She has a normal mood and affect. Her speech is normal and behavior is normal. Judgment and thought content normal. Her mood appears not anxious. Cognition and memory are normal. Cognition and memory  are not impaired. She does not exhibit a depressed mood. She exhibits normal recent memory and normal remote memory.   Positive modified DiX Hallpike.Marland Kitchen Upon sitting up.       Assessment & Plan:

## 2015-04-11 NOTE — Assessment & Plan Note (Signed)
Can use meclizine prn vertigo. Home movement treatment regimen given for home exercsies to do 3 times a day. Refer for balance retraining and/or Eply maneuver if not improving in 2 weeks.

## 2015-04-11 NOTE — Progress Notes (Signed)
Pre visit review using our clinic review tool, if applicable. No additional management support is needed unless otherwise documented below in the visit note. 

## 2015-06-22 ENCOUNTER — Telehealth: Payer: Self-pay | Admitting: Family Medicine

## 2015-06-22 DIAGNOSIS — M8580 Other specified disorders of bone density and structure, unspecified site: Secondary | ICD-10-CM

## 2015-06-22 DIAGNOSIS — E7800 Pure hypercholesterolemia, unspecified: Secondary | ICD-10-CM

## 2015-06-22 DIAGNOSIS — E781 Pure hyperglyceridemia: Secondary | ICD-10-CM

## 2015-06-22 NOTE — Telephone Encounter (Signed)
-----   Message from Marchia Bond sent at 06/20/2015  2:00 PM EDT ----- Regarding: Cpx labs 9/2, need orders thanks! :-) Please order  future cpx labs for pt's upcoming lab appt. Thanks Aniceto Boss

## 2015-06-23 ENCOUNTER — Other Ambulatory Visit (INDEPENDENT_AMBULATORY_CARE_PROVIDER_SITE_OTHER): Payer: BLUE CROSS/BLUE SHIELD

## 2015-06-23 DIAGNOSIS — M8580 Other specified disorders of bone density and structure, unspecified site: Secondary | ICD-10-CM | POA: Diagnosis not present

## 2015-06-23 DIAGNOSIS — E7800 Pure hypercholesterolemia, unspecified: Secondary | ICD-10-CM

## 2015-06-23 LAB — COMPREHENSIVE METABOLIC PANEL
ALK PHOS: 93 U/L (ref 33–130)
ALT: 20 U/L (ref 6–29)
AST: 21 U/L (ref 10–35)
Albumin: 4.2 g/dL (ref 3.6–5.1)
BILIRUBIN TOTAL: 0.6 mg/dL (ref 0.2–1.2)
BUN: 17 mg/dL (ref 7–25)
CALCIUM: 9.3 mg/dL (ref 8.6–10.4)
CO2: 23 mmol/L (ref 20–31)
Chloride: 103 mmol/L (ref 98–110)
Creat: 0.73 mg/dL (ref 0.50–0.99)
Glucose, Bld: 95 mg/dL (ref 65–99)
Potassium: 4.9 mmol/L (ref 3.5–5.3)
Sodium: 138 mmol/L (ref 135–146)
TOTAL PROTEIN: 7.1 g/dL (ref 6.1–8.1)

## 2015-06-23 LAB — LIPID PANEL
CHOLESTEROL: 174 mg/dL (ref 125–200)
HDL: 47 mg/dL (ref >=46)
LDL Cholesterol: 106 mg/dL (ref <130)
TRIGLYCERIDES: 105 mg/dL (ref <150)
Total CHOL/HDL Ratio: 3.7 Ratio (ref <=5.0)
VLDL: 21 mg/dL (ref <30)

## 2015-06-24 LAB — VITAMIN D 25 HYDROXY (VIT D DEFICIENCY, FRACTURES): Vit D, 25-Hydroxy: 43 ng/mL (ref 30–100)

## 2015-06-30 ENCOUNTER — Encounter: Payer: Self-pay | Admitting: Family Medicine

## 2015-06-30 ENCOUNTER — Ambulatory Visit (INDEPENDENT_AMBULATORY_CARE_PROVIDER_SITE_OTHER): Payer: BLUE CROSS/BLUE SHIELD | Admitting: Family Medicine

## 2015-06-30 VITALS — BP 116/64 | HR 76 | Temp 98.0°F | Ht 62.0 in | Wt 123.5 lb

## 2015-06-30 DIAGNOSIS — E7800 Pure hypercholesterolemia, unspecified: Secondary | ICD-10-CM

## 2015-06-30 DIAGNOSIS — Z0000 Encounter for general adult medical examination without abnormal findings: Secondary | ICD-10-CM

## 2015-06-30 DIAGNOSIS — I10 Essential (primary) hypertension: Secondary | ICD-10-CM

## 2015-06-30 MED ORDER — LISINOPRIL-HYDROCHLOROTHIAZIDE 10-12.5 MG PO TABS: 1.0000 | ORAL_TABLET | Freq: Every day | ORAL | Status: DC

## 2015-06-30 MED ORDER — SIMVASTATIN 20 MG PO TABS: ORAL_TABLET | ORAL | Status: DC

## 2015-06-30 NOTE — Progress Notes (Signed)
Pre visit review using our clinic review tool, if applicable. No additional management support is needed unless otherwise documented below in the visit note. 

## 2015-06-30 NOTE — Assessment & Plan Note (Signed)
Well controlled. Continue current medication.. Encouraged  Continued exercise, weight loss, healthy eating habits.

## 2015-06-30 NOTE — Patient Instructions (Signed)
Keep up the great work on healthy eating, exercise and weight maintenance!

## 2015-06-30 NOTE — Progress Notes (Signed)
The patient is here for annual wellness exam and preventative care.   Hypertension: Well controlled on lisinopril HCTZ BP Readings from Last 3 Encounters:  06/30/15 116/64  04/11/15 122/72  06/28/14 136/80   Using medication without problems or lightheadedness: None  Chest pain with exertion: None  Edema:None  Short of breath:None  Average home BPs: 130/80  Other issues:   Elevated Cholesterol: Back on simvastatin Goal LDL <130.  No SE to meds. Lab Results  Component Value Date   CHOL 174 06/23/2015   HDL 47 06/23/2015   LDLCALC 106 06/23/2015   LDLDIRECT 153.4 05/07/2012   TRIG 105 06/23/2015   CHOLHDL 3.7 06/23/2015  Diet compliance: Good, healthy eating.  Wt Readings from Last 3 Encounters:  06/30/15 123 lb 8 oz (56.019 kg)  04/11/15 122 lb 12 oz (55.679 kg)  06/28/14 124 lb 12 oz (56.586 kg)  Has been working hard on low cholesterol diet.  Exercise: Walking.  Other complaints:    Anxiety: She has been able to remain off zoloft and ambien. She is doing very well now that she is retired.   Review of Systems  Constitutional: Negative for fever, fatigue and unexpected weight change.  HENT: Negative for ear pain, congestion, sore throat, sneezing, trouble swallowing and sinus pressure.  Eyes: Negative for pain and itching.  Respiratory: Negative for cough, shortness of breath and wheezing.  Cardiovascular: Negative for chest pain, palpitations and leg swelling.  Gastrointestinal: Negative for nausea, abdominal pain, diarrhea, constipation and blood in stool.  Genitourinary: Negative for dysuria, hematuria, vaginal discharge, difficulty urinating and menstrual problem.  Skin: Negative for rash.  Neurological: Negative for syncope, weakness, light-headedness, numbness and headaches.  Psychiatric/Behavioral: Negative for confusion and dysphoric mood. The patient is not nervous/anxious.  Objective:   Physical Exam  Constitutional: Vital signs are  normal. She appears well-developed and well-nourished. She is cooperative. Non-toxic appearance. She does not appear ill. No distress.  HENT:  Head: Normocephalic.  Right Ear: Hearing, tympanic membrane, external ear and ear canal normal.  Left Ear: Hearing, tympanic membrane, external ear and ear canal normal.  Nose: Nose normal.  Eyes: Conjunctivae, EOM and lids are normal. Pupils are equal, round, and reactive to light. No foreign bodies found.  Neck: Trachea normal and normal range of motion. Neck supple. Carotid bruit is not present. No mass and no thyromegaly present.  Cardiovascular: Normal rate, regular rhythm, S1 normal, S2 normal, normal heart sounds and intact distal pulses. Exam reveals no gallop.  No murmur heard.  Pulmonary/Chest: Effort normal and breath sounds normal. No respiratory distress. She has no wheezes. She has no rhonchi. She has no rales.  Abdominal: Soft. Normal appearance and bowel sounds are normal. She exhibits no distension, no fluid wave, no abdominal bruit and no mass. There is no hepatosplenomegaly. There is no tenderness. There is no rebound, no guarding and no CVA tenderness. No hernia.  Genitourinary: Vagina normal and uterus normal. No breast swelling, tenderness, discharge or bleeding. There is no rash, tenderness or lesion on the right labia. There is no rash, tenderness or lesion on the left labia. Uterus is not enlarged and not tender. Right adnexum displays no mass, no tenderness and no fullness. Left adnexum displays no mass, no tenderness and no fullness. NO PAP PERFORMED. Lymphadenopathy:  She has no cervical adenopathy.  She has no axillary adenopathy.  Neurological: She is alert. She has normal strength. No cranial nerve deficit or sensory deficit.  Skin: Skin is warm, dry and intact.  No rash noted.  Psychiatric: Her speech is normal and behavior is normal. Judgment normal. Her mood appears not anxious. Cognition and memory are normal. She  does not exhibit a depressed mood.  Assessment & Plan:   The patient's preventative maintenance and recommended screening tests for an annual wellness exam were reviewed in full today.  Brought up to date unless services declined.  Counselled on the importance of diet, exercise, and its role in overall health and mortality.  The patient's FH and SH was reviewed, including their home life, tobacco status, and drug and alcohol status.   Vaccines: Uptodate with Td and zoster, plans to get a flu vaccine at pharmacy. PAP/DVE: partial hysterectomy, yearly DVE.  Mammo: stable 02/2015 Colonoscopy: Dr. Watt Climes Date: 02/15/2008, Next Due: 02/2018 Results: Diverticulosis  DEXA: 2010.. Osteopenia.Marland Kitchen Has not been taking ca and vit D. Does not EVER want to do DEXA, would not use meds to treat.  Vit D in nml range. HEP C screen: has had HIV screen: nonsmoker

## 2015-06-30 NOTE — Assessment & Plan Note (Signed)
Well controlled. Continue current medication.  

## 2015-09-07 ENCOUNTER — Ambulatory Visit: Payer: BLUE CROSS/BLUE SHIELD | Admitting: Family Medicine

## 2016-02-12 ENCOUNTER — Other Ambulatory Visit: Payer: Self-pay

## 2016-02-12 DIAGNOSIS — Z1231 Encounter for screening mammogram for malignant neoplasm of breast: Secondary | ICD-10-CM

## 2016-03-21 ENCOUNTER — Ambulatory Visit
Admission: RE | Admit: 2016-03-21 | Discharge: 2016-03-21 | Disposition: A | Discharge status: Home / Self Care | Payer: Medicare Other | Source: Ambulatory Visit

## 2016-03-21 DIAGNOSIS — Z1231 Encounter for screening mammogram for malignant neoplasm of breast: Secondary | ICD-10-CM | POA: Diagnosis not present

## 2016-06-27 ENCOUNTER — Telehealth: Payer: Self-pay | Admitting: Family Medicine

## 2016-06-27 DIAGNOSIS — M8580 Other specified disorders of bone density and structure, unspecified site: Secondary | ICD-10-CM

## 2016-06-27 DIAGNOSIS — E7800 Pure hypercholesterolemia, unspecified: Secondary | ICD-10-CM

## 2016-06-27 NOTE — Telephone Encounter (Signed)
-----   Message from Ellamae Sia sent at 06/26/2016  5:33 PM EDT ----- Regarding: Lab orders for Friday, 9.8.17 Patient is scheduled for CPX labs, please order future labs, Thanks , Karna Christmas

## 2016-06-28 ENCOUNTER — Other Ambulatory Visit (INDEPENDENT_AMBULATORY_CARE_PROVIDER_SITE_OTHER): Payer: Medicare Other

## 2016-06-28 ENCOUNTER — Other Ambulatory Visit: Payer: BLUE CROSS/BLUE SHIELD

## 2016-06-28 DIAGNOSIS — E7800 Pure hypercholesterolemia, unspecified: Secondary | ICD-10-CM | POA: Diagnosis not present

## 2016-06-28 DIAGNOSIS — M8580 Other specified disorders of bone density and structure, unspecified site: Secondary | ICD-10-CM | POA: Diagnosis not present

## 2016-06-28 LAB — LIPID PANEL
CHOLESTEROL: 187 mg/dL (ref 0–200)
HDL: 48.2 mg/dL (ref >39.00)
LDL Cholesterol: 99 mg/dL (ref 0–99)
NonHDL: 139.25
Total CHOL/HDL Ratio: 4
Triglycerides: 199 mg/dL — ABNORMAL HIGH (ref 0.0–149.0)
VLDL: 39.8 mg/dL (ref 0.0–40.0)

## 2016-06-28 LAB — COMPREHENSIVE METABOLIC PANEL
ALBUMIN: 4.5 g/dL (ref 3.5–5.2)
ALK PHOS: 90 U/L (ref 39–117)
ALT: 18 U/L (ref 0–35)
AST: 19 U/L (ref 0–37)
BUN: 13 mg/dL (ref 6–23)
CO2: 31 mEq/L (ref 19–32)
Calcium: 9.1 mg/dL (ref 8.4–10.5)
Chloride: 100 mEq/L (ref 96–112)
Creatinine, Ser: 0.68 mg/dL (ref 0.40–1.20)
GFR: 92.16 mL/min (ref >60.00)
Glucose, Bld: 101 mg/dL — ABNORMAL HIGH (ref 70–99)
POTASSIUM: 3.9 meq/L (ref 3.5–5.1)
Sodium: 135 mEq/L (ref 135–145)
TOTAL PROTEIN: 7.4 g/dL (ref 6.0–8.3)
Total Bilirubin: 0.6 mg/dL (ref 0.2–1.2)

## 2016-06-28 LAB — VITAMIN D 25 HYDROXY (VIT D DEFICIENCY, FRACTURES): VITD: 42.19 ng/mL (ref 30.00–100.00)

## 2016-06-28 NOTE — Addendum Note (Signed)
Addended by: Marchia Bond on: 06/28/2016 08:53 AM   Modules accepted: Orders

## 2016-07-05 ENCOUNTER — Ambulatory Visit (INDEPENDENT_AMBULATORY_CARE_PROVIDER_SITE_OTHER): Payer: Medicare Other | Admitting: Family Medicine

## 2016-07-05 ENCOUNTER — Encounter: Payer: Self-pay | Admitting: Family Medicine

## 2016-07-05 VITALS — BP 128/64 | HR 79 | Temp 97.9°F | Ht 61.5 in | Wt 123.5 lb

## 2016-07-05 DIAGNOSIS — E7800 Pure hypercholesterolemia, unspecified: Secondary | ICD-10-CM

## 2016-07-05 DIAGNOSIS — I10 Essential (primary) hypertension: Secondary | ICD-10-CM | POA: Diagnosis not present

## 2016-07-05 DIAGNOSIS — Z0000 Encounter for general adult medical examination without abnormal findings: Secondary | ICD-10-CM | POA: Diagnosis not present

## 2016-07-05 MED ORDER — SIMVASTATIN 20 MG PO TABS: ORAL_TABLET | ORAL | 3 refills | Status: DC

## 2016-07-05 MED ORDER — LISINOPRIL-HYDROCHLOROTHIAZIDE 10-12.5 MG PO TABS: 1.0000 | ORAL_TABLET | Freq: Every day | ORAL | 3 refills | Status: DC

## 2016-07-05 NOTE — Progress Notes (Signed)
Pre visit review using our clinic review tool, if applicable. No additional management support is needed unless otherwise documented below in the visit note. 

## 2016-07-05 NOTE — Progress Notes (Signed)
The patient is here for annual wellness exam and preventative care.   She is new to medicare this year.  Hypertension: Well controlled on lisinopril HCTZ BP Readings from Last 3 Encounters:  07/05/16 128/64  06/30/15 116/64  04/11/15 122/72  Using medication without problems or lightheadedness: None  Chest pain with exertion: None  Edema:None  Short of breath:None  Average home BPs: 130/80  Other issues:   Elevated Cholesterol: Back on simvastatin  AT Goal LDL <130.  No SE to meds. Lab Results  Component Value Date   CHOL 187 06/28/2016   HDL 48.20 06/28/2016   LDLCALC 99 06/28/2016   LDLDIRECT 153.4 05/07/2012   TRIG 199.0 (H) 06/28/2016   CHOLHDL 4 06/28/2016   Diet compliance: Good, healthy eating.  Wt Readings from Last 3 Encounters:  07/05/16 123 lb 8 oz (56 kg)  06/30/15 123 lb 8 oz (56 kg)  04/11/15 122 lb 12 oz (55.7 kg)  Body mass index is 22.96 kg/m. Has been working hard on low cholesterol diet.  Exercise: Walking, 2-3 times a week 30 min.  Other complaints:   Anxiety: She has been able to remain off zoloft and ambien. She is doing very well now that she is retired.  PHQ2 0  Social History /Family History/Past Medical History reviewed and updated if needed.   Review of Systems  Constitutional: Negative for fever, fatigue and unexpected weight change.  HENT: Negative for ear pain, congestion, sore throat, sneezing, trouble swallowing and sinus pressure.  Eyes: Negative for pain and itching.  Respiratory: Negative for cough, shortness of breath and wheezing.  Cardiovascular: Negative for chest pain, palpitations and leg swelling.  Gastrointestinal: Negative for nausea, abdominal pain, diarrhea, constipation and blood in stool.  Genitourinary: Negative for dysuria, hematuria, vaginal discharge, difficulty urinating and menstrual problem.  Skin: Negative for rash.  Neurological: Negative for syncope, weakness, light-headedness,  numbness and headaches.  Psychiatric/Behavioral: Negative for confusion and dysphoric mood. The patient is not nervous/anxious.  Objective:   Physical Exam  Constitutional: Vital signs are normal. She appears well-developed and well-nourished. She is cooperative. Non-toxic appearance. She does not appear ill. No distress.  HENT:  Head: Normocephalic.  Right Ear: Hearing, tympanic membrane, external ear and ear canal normal.  Left Ear: Hearing, tympanic membrane, external ear and ear canal normal.  Nose: Nose normal.  Eyes: Conjunctivae, EOM and lids are normal. Pupils are equal, round, and reactive to light. No foreign bodies found.  Neck: Trachea normal and normal range of motion. Neck supple. Carotid bruit is not present. No mass and no thyromegaly present.  Cardiovascular: Normal rate, regular rhythm, S1 normal, S2 normal, normal heart sounds and intact distal pulses. Exam reveals no gallop.  No murmur heard.  Pulmonary/Chest: Effort normal and breath sounds normal. No respiratory distress. She has no wheezes. She has no rhonchi. She has no rales.  Abdominal: Soft. Normal appearance and bowel sounds are normal. She exhibits no distension, no fluid wave, no abdominal bruit and no mass. There is no hepatosplenomegaly. There is no tenderness. There is no rebound, no guarding and no CVA tenderness. No hernia.  Genitourinary:  Normal introitus for age, no external lesions, no vaginal discharge, mucosa pink and moist, no vaginal or cervical lesions, no vaginal atrophy, no friaility or hemorrhage, normal uterus size and position, no adnexal masses or tenderness.  Chaperoned exam.  Breast exam: No mass, nodules, thickening, tenderness, bulging, retraction, inflamation, nipple discharge or skin changes noted.  No axillary or clavicular LA.  Chaperoned exam.  NO PAP. Lymphadenopathy:  She has no cervical adenopathy.  She has no axillary adenopathy.  Neurological: She is alert. She has  normal strength. No cranial nerve deficit or sensory deficit.  Skin: Skin is warm, dry and intact. No rash noted.  Psychiatric: Her speech is normal and behavior is normal. Judgment normal. Her mood appears not anxious. Cognition and memory are normal. She does not exhibit a depressed mood.  Assessment & Plan:   The patient's preventative maintenance and recommended screening tests for an annual wellness exam were reviewed in full today.  Brought up to date unless services declined.  Counselled on the importance of diet, exercise, and its role in overall health and mortality.  The patient's FH and SH was reviewed, including their home life, tobacco status, and drug and alcohol status.   Vaccines: Uptodate zoster, plans to get a flu, prevnar vaccine at pharmacy. Consider Td. PAP/DVE: partial hysterectomy, DVE wishes to continue yearly. Mammo: stable 03/2016, pt wishes to do MBE yearly. Colonoscopy: Dr. Watt Climes Date: 02/15/2008, Next Due: 02/2018 Results: Diverticulosis  DEXA: 2010.. Osteopenia.Marland Kitchen Has not been taking ca and vit D. Does not EVER want to do DEXA, would not use meds to treat.  Vit D in nml range. HEP C screen: done HIV screen: refused nonsmoker

## 2016-07-05 NOTE — Patient Instructions (Addendum)
Flu and prevnar at pharmacy.  Bring by a copy of advanced directives when you can.  Work to goal of 150 minutes of exercise per week and starting strengthening exercises.

## 2016-07-05 NOTE — Assessment & Plan Note (Signed)
Well controlled. Continue current medication.  

## 2016-07-05 NOTE — Assessment & Plan Note (Signed)
LDL at goal.. Trig now high.. Decrease concentrated sweets in diet. Pt eats a A LOT of apples.

## 2016-07-18 ENCOUNTER — Telehealth: Payer: Self-pay

## 2016-07-18 DIAGNOSIS — H2513 Age-related nuclear cataract, bilateral: Secondary | ICD-10-CM | POA: Diagnosis not present

## 2016-07-18 NOTE — Telephone Encounter (Signed)
I spoke with pt and she is not having a problem now; offered pt appt for 07/19/16 and pt wants to see DR Diona Browner next week; pt scheduled appt on 07/23/16 at 10:45. If p condition changes or worsens prior to appt pt will go to ED. FYI to Dr Diona Browner.

## 2016-07-18 NOTE — Telephone Encounter (Signed)
PLEASE NOTE: All timestamps contained within this report are represented as [place] Standard Time. CONFIDENTIALTY NOTICE: This fax transmission is intended only for the addressee. It contains information that is legally privileged, confidential or otherwise protected from use or disclosure. If you are not the intended recipient, you are strictly prohibited from reviewing, disclosing, copying using or disseminating any of this information or taking any action in reliance on or regarding this information. If you have received this fax in error, please notify us immediately by telephone so that we can arrange for its return to [place]. Phone: [phone], Toll-Free: [phone], Fax: [phone] Page: 1 of 1 Call Id: [id number] [place] [facility] - Client Nonclinical Telephone Record [place] [facility] - Client Client Site [place] - Day Physician Eliezer Lofts - MD Contact Type Call Who Is Calling Patient / Member / Family / Caregiver Caller Name [place] Phone Number [phone] Patient Name Halimatou Boring Call Type Message Only Information Provided Reason for Call Request to Schedule Office Appointment Initial Comment Caller states, she would like an appointment and she has been having irregular heart beat from time to time- several months. Verified. Call Closed By: Lonia Farber Transaction Date/Time: 07/18/2016 3:17:17 PM (ET)

## 2016-07-19 NOTE — Telephone Encounter (Signed)
Noted  

## 2016-07-23 ENCOUNTER — Encounter: Payer: Self-pay | Admitting: Family Medicine

## 2016-07-23 ENCOUNTER — Ambulatory Visit (INDEPENDENT_AMBULATORY_CARE_PROVIDER_SITE_OTHER): Payer: Medicare Other | Admitting: Family Medicine

## 2016-07-23 DIAGNOSIS — I499 Cardiac arrhythmia, unspecified: Secondary | ICD-10-CM | POA: Diagnosis not present

## 2016-07-23 LAB — CBC WITH DIFFERENTIAL/PLATELET
BASOS PCT: 0.7 % (ref 0.0–3.0)
Basophils Absolute: 0.1 10*3/uL (ref 0.0–0.1)
EOS ABS: 0.2 10*3/uL (ref 0.0–0.7)
Eosinophils Relative: 3.1 % (ref 0.0–5.0)
HCT: 41.6 % (ref 36.0–46.0)
Hemoglobin: 14 g/dL (ref 12.0–15.0)
Lymphocytes Relative: 23 % (ref 12.0–46.0)
Lymphs Abs: 1.8 10*3/uL (ref 0.7–4.0)
MCHC: 33.7 g/dL (ref 30.0–36.0)
MCV: 88 fl (ref 78.0–100.0)
MONO ABS: 0.5 10*3/uL (ref 0.1–1.0)
Monocytes Relative: 6.3 % (ref 3.0–12.0)
NEUTROS ABS: 5.3 10*3/uL (ref 1.4–7.7)
NEUTROS PCT: 66.9 % (ref 43.0–77.0)
PLATELETS: 333 10*3/uL (ref 150.0–400.0)
RBC: 4.72 Mil/uL (ref 3.87–5.11)
RDW: 13.3 % (ref 11.5–15.5)
WBC: 7.9 10*3/uL (ref 4.0–10.5)

## 2016-07-23 LAB — TSH: TSH: 1.2 u[IU]/mL (ref 0.35–4.50)

## 2016-07-23 LAB — T4, FREE: FREE T4: 0.83 ng/dL (ref 0.60–1.60)

## 2016-07-23 LAB — T3, FREE: T3, Free: 2.8 pg/mL (ref 2.3–4.2)

## 2016-07-23 NOTE — Progress Notes (Signed)
Pre visit review using our clinic review tool, if applicable. No additional management support is needed unless otherwise documented below in the visit note. 

## 2016-07-23 NOTE — Assessment & Plan Note (Signed)
No red flags. Eval thyroid, cbc. EKG NSR, no LVH. Given last for 1 hour at a time, pt age and family history ... Will refer to cardiology for further eval and consideration of monitor.

## 2016-07-23 NOTE — Patient Instructions (Signed)
Stop at lab and front desk on way out.

## 2016-07-23 NOTE — Progress Notes (Signed)
   Subjective:    Patient ID: Martha Hayes, female    DOB: [birth year], 65 y.o.   MRN: [id number]  HPI  65 year old female pt presents for new onset palpitations.  She has been noting off and on in last 6 months. Feels a fullness, a pounding, heart bate is irregular.. Not fast. 2 beats then skip. Last continuously for a hour.  Occurring every few weeks to once a month. Occurring at rest. No associated symptoms, no SOB, no dizzy, no CP.   Caffeine: 1-2 cups a coffee daily  OTC meds: none  Prescription meds: not that cause palpitations.  BP Readings from Last 3 Encounters:  07/23/16 126/64  07/05/16 128/64  06/30/15 116/64    Dx with narrow angle glaucoma.. Has upcoming iridectomy.  Hx of depression and anxiety, but this has been well controlled   Family history of mother with CHF, sister with bundle branch block with pace maker.  Review of Systems  Constitutional: Negative for fatigue and fever.  HENT: Negative for ear pain.   Eyes: Negative for pain.  Respiratory: Negative for chest tightness and shortness of breath.   Cardiovascular: Negative for chest pain, palpitations and leg swelling.  Gastrointestinal: Negative for abdominal pain.  Genitourinary: Negative for dysuria.       Objective:   Physical Exam  Constitutional: Vital signs are normal. She appears well-developed and well-nourished. She is cooperative.  Non-toxic appearance. She does not appear ill. No distress.  HENT:  Head: Normocephalic.  Right Ear: Hearing, tympanic membrane, external ear and ear canal normal. Tympanic membrane is not erythematous, not retracted and not bulging.  Left Ear: Hearing, tympanic membrane, external ear and ear canal normal. Tympanic membrane is not erythematous, not retracted and not bulging.  Nose: No mucosal edema or rhinorrhea. Right sinus exhibits no maxillary sinus tenderness and no frontal sinus tenderness. Left sinus exhibits no maxillary sinus tenderness and no frontal sinus  tenderness.  Mouth/Throat: Uvula is midline, oropharynx is clear and moist and mucous membranes are normal.  Eyes: Conjunctivae, EOM and lids are normal. Pupils are equal, round, and reactive to light. Lids are everted and swept, no foreign bodies found.  Neck: Trachea normal and normal range of motion. Neck supple. Carotid bruit is not present. No thyroid mass and no thyromegaly present.  Cardiovascular: Normal rate, regular rhythm, S1 normal, S2 normal, normal heart sounds, intact distal pulses and normal pulses.  Exam reveals no gallop and no friction rub.   No murmur heard. Pulmonary/Chest: Effort normal and breath sounds normal. No tachypnea. No respiratory distress. She has no decreased breath sounds. She has no wheezes. She has no rhonchi. She has no rales.  Abdominal: Soft. Normal appearance and bowel sounds are normal. There is no tenderness.  Neurological: She is alert.  Skin: Skin is warm, dry and intact. No rash noted.  Psychiatric: Her speech is normal and behavior is normal. Judgment and thought content normal. Her mood appears not anxious. Cognition and memory are normal. She does not exhibit a depressed mood.          Assessment & Plan:

## 2016-07-25 DIAGNOSIS — H40033 Anatomical narrow angle, bilateral: Secondary | ICD-10-CM | POA: Diagnosis not present

## 2016-07-29 DIAGNOSIS — Z23 Encounter for immunization: Secondary | ICD-10-CM | POA: Diagnosis not present

## 2016-07-31 ENCOUNTER — Ambulatory Visit (INDEPENDENT_AMBULATORY_CARE_PROVIDER_SITE_OTHER): Payer: Medicare Other | Admitting: Internal Medicine

## 2016-07-31 ENCOUNTER — Encounter: Payer: Self-pay | Admitting: Internal Medicine

## 2016-07-31 ENCOUNTER — Ambulatory Visit (INDEPENDENT_AMBULATORY_CARE_PROVIDER_SITE_OTHER): Payer: Medicare Other

## 2016-07-31 VITALS — BP 122/64 | Ht 61.5 in | Wt 124.5 lb

## 2016-07-31 DIAGNOSIS — R0789 Other chest pain: Secondary | ICD-10-CM | POA: Diagnosis not present

## 2016-07-31 DIAGNOSIS — R002 Palpitations: Secondary | ICD-10-CM | POA: Diagnosis not present

## 2016-07-31 DIAGNOSIS — I10 Essential (primary) hypertension: Secondary | ICD-10-CM | POA: Diagnosis not present

## 2016-07-31 NOTE — Progress Notes (Signed)
New Outpatient Visit Date: 07/31/2016  Referring Provider: Jinny Sanders, MD [place], [place] [address]  Chief Complaint: Palpitations  HPI:  Martha Hayes is a 65 y.o. year-old female with history of retention and hyperlipidemia, who has been referred by Dr. Diona Browner for evaluation of palpitations. The patient notes occasional pounding in the chest and irregular beats, which been present for 6 months. They seem to be happening more frequently over the last few weeks. Over the last 2 weeks, she has experienced 4 episodes. The episodes typically last for about 15 minutes before resolving spontaneously. She has an associated "fullness" in the neck and pounding in the chest. She denies chest pain, shortness of breath, and lightheadedness. She also denies orthopnea, PND, and claudication. The palpitations most often occur in the afternoon while she is seated. She does not know of any clear precipitants.  She will sometimes feel her pulse and has the sense that every few beats or skipped. She denies a history of prior cardiac disease. She has not undergone previous cardiovascular workup. She consumes 2 cups of coffee per day and drinks a glass of wine about 4 nights per week.   Martha Hayes is very active and walks regularly without any symptoms.  --------------------------------------------------------------------------------------------------  Cardiovascular History & Procedures: Cardiovascular Problems:  Palpitations  Risk Factors:  None  Cath/PCI:  None  CV Surgery:  None  EP Procedures and Devices:  None  Non-Invasive Evaluation(s):  None  Recent CV Pertinent Labs: Lab Results  Component Value Date   CHOL 187 06/28/2016   HDL 48.20 06/28/2016   LDLCALC 99 06/28/2016   LDLDIRECT 153.4 05/07/2012   TRIG 199.0 (H) 06/28/2016   CHOLHDL 4 06/28/2016   K 3.9 06/28/2016   BUN 13 06/28/2016   CREATININE 0.68 06/28/2016   CREATININE 0.73 06/23/2015    --------------------------------------------------------------------------------------------------  Past Medical History:  Diagnosis Date  . Anemia, unspecified    due to menorrhagia  . DDD (degenerative disc disease), lumbosacral    MRI lumbar 2003  . Hyperlipidemia   . Hypertension     Past Surgical History:  Procedure Laterality Date  . [place]   right  . PARTIAL HYSTERECTOMY  1992   uterine fibroids  . TONSILLECTOMY  1959    Outpatient Encounter Prescriptions as of 07/31/2016  Medication Sig  . lisinopril-hydrochlorothiazide (PRINZIDE,ZESTORETIC) 10-12.5 MG tablet Take 1 tablet by mouth daily.  . simvastatin (ZOCOR) 20 MG tablet TAKE 1 TABLET (20 MG TOTAL) BY MOUTH EVERY EVENING.   No facility-administered encounter medications on file as of 07/31/2016.     Allergies: Penicillins  Social History   Social History  . Marital status: Married    Spouse name: N/A  . Number of Martha Hayes: N/A  . Years of education: N/A   Occupational History  . RN [employer]    [employer]   Social History Main Topics  . Smoking status: Never Smoker  . Smokeless tobacco: Never Used  . Alcohol use 2.4 oz/week    4 Glasses of wine per week  . Drug use: No  . Sexual activity: Not on file   Other Topics Concern  . Not on file   Social History Narrative   Regular exercise, no   Martha Hayes 2, DM Type 1- age 89   Diet:: fruit, veggies, no H2O (+) soda, (+) tea, (+) coffee    Family History  Problem Relation Age of Onset  . Diabetes Father   . Hypertension Father   .  Seizures Father     petit mal seizure disorder  . Heart disease Father     defibrillation  . Heart disease Mother     CAD, two stents  . Diabetes Mother     developed in 17's  . Osteoporosis Mother   . Hypertension Mother   . Heart disease Sister     pacemaker  . Sudden Cardiac Death Sister 63    aborted; had h/o bundle branch block    Review of Systems: A 12-system review of systems  was performed and was negative except as noted in the HPI.  --------------------------------------------------------------------------------------------------  Physical Exam: BP 122/64 (BP Location: Right Arm, Patient Position: Sitting, Cuff Size: Normal)   Ht 5' 1.5" (1.562 m)   Wt 124 lb 8 oz (56.5 kg)   BMI 23.14 kg/m   General:  Well-developed, well-nourished woman seated comfortably in the exam room. HEENT: No conjunctival pallor or scleral icterus.  Moist mucous membranes.  OP clear. Neck: Supple without lymphadenopathy, thyromegaly, JVD, or HJR.  No carotid bruit. Lungs: Normal work of breathing.  Clear to auscultation bilaterally without wheezes or crackles. Heart: Regular rate and rhythm without murmurs, rubs, or gallops.  Non-displaced PMI. Abd: Bowel sounds present.  Soft, NT/ND without hepatosplenomegaly Ext: No lower extremity edema.  Radial, PT, and DP pulses are 2+ bilaterally Skin: warm and dry without rash Neuro: CNIII-XII intact.  Strength and fine-touch sensation intact in upper and lower extremities bilaterally. Psych: Normal mood and affect.  EKG:  Normal sinus rhythm without significant abnormalities. Resting heart rate is 98 bpm.  Lab Results  Component Value Date   WBC 7.9 07/23/2016   HGB 14.0 07/23/2016   HCT 41.6 07/23/2016   MCV 88.0 07/23/2016   PLT 333.0 07/23/2016    Lab Results  Component Value Date   NA 135 06/28/2016   K 3.9 06/28/2016   CL 100 06/28/2016   CO2 31 06/28/2016   BUN 13 06/28/2016   CREATININE 0.68 06/28/2016   GLUCOSE 101 (H) 06/28/2016   ALT 18 06/28/2016    Lab Results  Component Value Date   CHOL 187 06/28/2016   HDL 48.20 06/28/2016   LDLCALC 99 06/28/2016   LDLDIRECT 153.4 05/07/2012   TRIG 199.0 (H) 06/28/2016   CHOLHDL 4 06/28/2016    --------------------------------------------------------------------------------------------------  ASSESSMENT AND PLAN: 1. Palpitations Patient describes palpitations off  and on over the course of the last 6 months, more frequent in the last 2 weeks. The episodic skipped beats are suggestive of supraventricular or ventricular ectopy with compensatory pause. She notes a sense of fullness in her neck with the palpitations but otherwise has no worrisome symptoms such as chest pain, shortness of breath, and lightheadedness. We have agreed to proceed with a 14 day event monitor to further characterize her palpitations. We will discuss further workup and medical therapy based on the results of this.  2. Hypertension Blood pressure is well controlled today. We will not make any changes to her regimen.  Follow-up: Return to clinic in 6 weeks.  We will discuss the results of her monitor via phone when the results are available.  [name], MD 07/31/2016 2:19 PM

## 2016-07-31 NOTE — Patient Instructions (Signed)
Medication Instructions:  Your physician recommends that you continue on your current medications as directed. Please refer to the Current Medication list given to you today.   Labwork: none  Testing/Procedures: Your physician has recommended that you wear an event monitor. Event monitors are medical devices that record the heart's electrical activity. Doctors most often [place] these monitors to diagnose arrhythmias. Arrhythmias are problems with the speed or rhythm of the heartbeat. The monitor is a small, portable device. You can wear one while you do your normal daily activities. This is usually used to diagnose what is causing palpitations/syncope (passing out).    Follow-Up: Your physician recommends that you schedule a follow-up appointment in: 6 weeks with Dr. Saunders Revel.   Any Other Special Instructions Will Be Listed Below (If Applicable).     If you need a refill on your cardiac medications before your next appointment, please call your pharmacy.

## 2016-08-05 DIAGNOSIS — H40033 Anatomical narrow angle, bilateral: Secondary | ICD-10-CM | POA: Diagnosis not present

## 2016-08-14 DIAGNOSIS — R002 Palpitations: Secondary | ICD-10-CM

## 2016-08-20 DIAGNOSIS — R002 Palpitations: Secondary | ICD-10-CM | POA: Diagnosis not present

## 2016-08-27 ENCOUNTER — Telehealth: Payer: Self-pay | Admitting: Internal Medicine

## 2016-08-27 MED ORDER — CARVEDILOL 3.125 MG PO TABS: 3.1250 mg | ORAL_TABLET | Freq: Two times a day (BID) | ORAL | 5 refills | Status: DC

## 2016-08-27 NOTE — Telephone Encounter (Signed)
I spoke with the patient regarding the results of her cardiac event monitor, which demonstrated paroxysmal supraventricular tachycardia as well as rare episodes of isolated supraventricular and ventricular ectopy. The patient has continued to have some palpitations. We discussed treatment options, including avoidance of caffeine and medical therapy. We have agreed to start carvedilol 3.125 mg twice a day. She is due to see me for follow-up later this month; however, I encouraged her to contact our clinic in about a week's time if her symptoms have improved with the addition of beta blocker. Her follow-up could be pushed out to 3 months if this is the case.  [name], MD [facility] Pager: [phone]

## 2016-09-06 ENCOUNTER — Telehealth: Payer: Self-pay | Admitting: Internal Medicine

## 2016-09-06 NOTE — Telephone Encounter (Signed)
Pt would just like to let us know that she is doing well with the new medication we placed her on, since this is going well she states she was told by Dr End that she could move her appointment from next week to Deb 2018 Just FYI

## 2016-09-06 NOTE — Telephone Encounter (Signed)
That is fine. We can schedule her to see me in about 3 months.  Thanks.

## 2016-09-06 NOTE — Telephone Encounter (Signed)
Spoke with patient and she states that since starting the new medication she is feeling much better. She states that Dr. Saunders Revel told her if she was feeling better then she could extend her appointment out for 3 months. Let her know to please give [place] a call if she needs anything or if she needs to be seen sooner. She was appreciative for the call back and let her know that I would let Dr. Saunders Revel know that she was feeling better. She had no further questions at this time.

## 2016-09-11 ENCOUNTER — Ambulatory Visit: Payer: Medicare Other | Admitting: Internal Medicine

## 2016-12-04 ENCOUNTER — Encounter: Payer: Self-pay | Admitting: Internal Medicine

## 2016-12-04 ENCOUNTER — Ambulatory Visit (INDEPENDENT_AMBULATORY_CARE_PROVIDER_SITE_OTHER): Payer: Medicare Other | Admitting: Internal Medicine

## 2016-12-04 VITALS — BP 122/60 | HR 70 | Ht 61.0 in | Wt 126.2 lb

## 2016-12-04 DIAGNOSIS — I471 Supraventricular tachycardia: Secondary | ICD-10-CM

## 2016-12-04 DIAGNOSIS — I10 Essential (primary) hypertension: Secondary | ICD-10-CM

## 2016-12-04 MED ORDER — CARVEDILOL 3.125 MG PO TABS: 3.1250 mg | ORAL_TABLET | Freq: Two times a day (BID) | ORAL | 3 refills | Status: DC

## 2016-12-04 NOTE — Progress Notes (Signed)
Follow-up Outpatient Visit Date: 12/04/2016  Primary Care Provider: Eliezer Lofts, MD [place] [address]  Chief Complaint: Follow-up palpitations  HPI:  Martha Hayes is a 66 y.o. year-old female with history of paroxysmal superventricular tachycardia noted on event monitor, hypertension, and hyperlipidemia, who presents for follow-up of palpitations. I last saw her on 07/31/16, at which time we ordered an event monitor for evaluation of palpitations. This revealed 2 brief episodes of SVT as well as rare PACs and PVCs, prompting [place] to start carvedilol. Since then, the patient has not had any further palpitations. She is tolerating the medication well. She denies chest pain, shortness of breath, lightheadedness, and edema.  --------------------------------------------------------------------------------------------------  Cardiovascular History & Procedures: Cardiovascular Problems:  Paroxysmal supraventricular tachycardia  Risk Factors:  None  Cath/PCI:  None  CV Surgery:  None  EP Procedures and Devices:  14-day event monitor (07/31/16): Predominant rhythm was sinus with average rate 75 bpm (range 50-153 bpm). Rare isolated ventricular and supraventricular ectopy was noted. There were 2 episodes of SVT lasting 10 beats with a maximum rate of 164 bpm. Morphology is suggestive of atrial tachycardia. Patient reported symptoms correspond to sinus rhythm with isolated PVCs.  Non-Invasive Evaluation(s):  None  Recent CV Pertinent Labs: Lab Results  Component Value Date   CHOL 187 06/28/2016   HDL 48.20 06/28/2016   LDLCALC 99 06/28/2016   LDLDIRECT 153.4 05/07/2012   TRIG 199.0 (H) 06/28/2016   CHOLHDL 4 06/28/2016   K 3.9 06/28/2016   BUN 13 06/28/2016   CREATININE 0.68 06/28/2016   CREATININE 0.73 06/23/2015    Past medical and surgical history were reviewed and updated in EPIC.  Outpatient Encounter Prescriptions as of 12/04/2016  Medication  Sig  . carvedilol (COREG) 3.125 MG tablet Take 1 tablet (3.125 mg total) by mouth 2 (two) times daily with a meal.  . lisinopril-hydrochlorothiazide (PRINZIDE,ZESTORETIC) 10-12.5 MG tablet Take 1 tablet by mouth daily.  . simvastatin (ZOCOR) 20 MG tablet TAKE 1 TABLET (20 MG TOTAL) BY MOUTH EVERY EVENING.   No facility-administered encounter medications on file as of 12/04/2016.     Allergies: Penicillins  Social History   Social History  . Marital status: Married    Spouse name: N/A  . Number of children: N/A  . Years of education: N/A   Occupational History  . RN [employer]    [employer]   Social History Main Topics  . Smoking status: Never Smoker  . Smokeless tobacco: Never Used  . Alcohol use 2.4 oz/week    4 Glasses of wine per week  . Drug use: No  . Sexual activity: Not on file   Other Topics Concern  . Not on file   Social History Narrative   Regular exercise, no   Children 2, DM Type 1- age 73   Diet:: fruit, veggies, no H2O (+) soda, (+) tea, (+) coffee    Family History  Problem Relation Age of Onset  . Diabetes Father   . Hypertension Father   . Seizures Father     petit mal seizure disorder  . Heart disease Father     defibrillation  . Heart disease Mother     CAD, two stents  . Diabetes Mother     developed in 26's  . Osteoporosis Mother   . Hypertension Mother   . Heart disease Sister     pacemaker  . Sudden Cardiac Death Sister 33    aborted; had h/o bundle  branch block    Review of Systems: A 12-system review of systems was performed and was negative except as noted in the HPI.  --------------------------------------------------------------------------------------------------  Physical Exam: BP 122/60 (BP Location: Left Arm, Patient Position: Sitting, Cuff Size: Normal)   Pulse 70   Ht 5\' 1"  (1.549 m)   Wt 126 lb 4 oz (57.3 kg)   BMI 23.85 kg/m   General:  Well-developed, well-nourished woman seated comfortably in exam  room. HEENT: No conjunctival pallor or scleral icterus.  Moist mucous membranes.  OP clear. Neck: Supple without lymphadenopathy, thyromegaly, JVD, or HJR. Lungs: Normal work of breathing.  Clear to auscultation bilaterally without wheezes or crackles. Heart: Regular rate and rhythm without murmurs, rubs, or gallops.  Non-displaced PMI. Abd: Bowel sounds present.  Soft, NT/ND without hepatosplenomegaly Ext: No lower extremity edema.  Radial, PT, and DP pulses are 2+ bilaterally. Skin: warm and dry without rash  EKG:  Normal sinus rhythm with rightward axis. Compared with prior tracing from 07/31/16, axis has shifted slightly towards the right. Otherwise, there have been no significant changes (I have personally reviewed both tracings).  Lab Results  Component Value Date   WBC 7.9 07/23/2016   HGB 14.0 07/23/2016   HCT 41.6 07/23/2016   MCV 88.0 07/23/2016   PLT 333.0 07/23/2016    Lab Results  Component Value Date   NA 135 06/28/2016   K 3.9 06/28/2016   CL 100 06/28/2016   CO2 31 06/28/2016   BUN 13 06/28/2016   CREATININE 0.68 06/28/2016   GLUCOSE 101 (H) 06/28/2016   ALT 18 06/28/2016    Lab Results  Component Value Date   CHOL 187 06/28/2016   HDL 48.20 06/28/2016   LDLCALC 99 06/28/2016   LDLDIRECT 153.4 05/07/2012   TRIG 199.0 (H) 06/28/2016   CHOLHDL 4 06/28/2016    --------------------------------------------------------------------------------------------------  ASSESSMENT AND PLAN: Paroxysmal supraventricular tachycardia No symptoms with addition of low-dose carvedilol, which Martha Hayes is tolerating well. We will continue this indefinitely. She should contact us for further evaluation if her symptoms recur.  Hypertension Blood pressure well controlled. Patient can continue to follow with her PCP for management of this.  Follow-up: Return to clinic in one year.  [name], MD 12/04/2016 8:45 AM

## 2016-12-04 NOTE — Patient Instructions (Signed)
Medication Instructions:  Your physician recommends that you continue on your current medications as directed. Please refer to the Current Medication list given to you today.   Labwork: none  Testing/Procedures: none  Follow-Up: Your physician wants you to follow-up in: 12 MONTHS WITH DR END.  You will receive a reminder letter in the mail two months in advance. If you don't receive a letter, please call our office to schedule the follow-up appointment.  If you need a refill on your cardiac medications before your next appointment, please call your pharmacy.   

## 2017-02-12 ENCOUNTER — Other Ambulatory Visit: Payer: Self-pay | Admitting: Family Medicine

## 2017-02-12 DIAGNOSIS — Z1231 Encounter for screening mammogram for malignant neoplasm of breast: Secondary | ICD-10-CM

## 2017-03-24 ENCOUNTER — Ambulatory Visit
Admission: RE | Admit: 2017-03-24 | Discharge: 2017-03-24 | Disposition: A | Discharge status: Home / Self Care | Payer: Medicare Other | Source: Ambulatory Visit | Attending: Family Medicine | Admitting: Family Medicine

## 2017-03-24 DIAGNOSIS — Z1231 Encounter for screening mammogram for malignant neoplasm of breast: Secondary | ICD-10-CM

## 2017-04-09 DIAGNOSIS — L7211 Pilar cyst: Secondary | ICD-10-CM | POA: Diagnosis not present

## 2017-04-09 DIAGNOSIS — D229 Melanocytic nevi, unspecified: Secondary | ICD-10-CM | POA: Diagnosis not present

## 2017-05-02 DIAGNOSIS — H40003 Preglaucoma, unspecified, bilateral: Secondary | ICD-10-CM | POA: Diagnosis not present

## 2017-07-22 ENCOUNTER — Ambulatory Visit: Payer: Medicare Other

## 2017-07-29 ENCOUNTER — Ambulatory Visit: Payer: Medicare Other | Admitting: Family Medicine

## 2017-08-04 ENCOUNTER — Other Ambulatory Visit: Payer: Self-pay | Admitting: Family Medicine

## 2017-08-05 ENCOUNTER — Telehealth: Payer: Self-pay | Admitting: Family Medicine

## 2017-08-05 ENCOUNTER — Ambulatory Visit (INDEPENDENT_AMBULATORY_CARE_PROVIDER_SITE_OTHER): Payer: Medicare Other

## 2017-08-05 VITALS — BP 94/62 | HR 66 | Temp 98.1°F | Ht 62.0 in | Wt 125.0 lb

## 2017-08-05 DIAGNOSIS — Z0000 Encounter for general adult medical examination without abnormal findings: Secondary | ICD-10-CM

## 2017-08-05 DIAGNOSIS — E7800 Pure hypercholesterolemia, unspecified: Secondary | ICD-10-CM

## 2017-08-05 DIAGNOSIS — Z23 Encounter for immunization: Secondary | ICD-10-CM

## 2017-08-05 LAB — COMPREHENSIVE METABOLIC PANEL
ALT: 20 U/L (ref 0–35)
AST: 17 U/L (ref 0–37)
Albumin: 4.3 g/dL (ref 3.5–5.2)
Alkaline Phosphatase: 88 U/L (ref 39–117)
BUN: 18 mg/dL (ref 6–23)
CALCIUM: 9.5 mg/dL (ref 8.4–10.5)
CHLORIDE: 101 meq/L (ref 96–112)
CO2: 25 meq/L (ref 19–32)
Creatinine, Ser: 0.73 mg/dL (ref 0.40–1.20)
GFR: 84.62 mL/min (ref >60.00)
Glucose, Bld: 95 mg/dL (ref 70–99)
POTASSIUM: 4 meq/L (ref 3.5–5.1)
Sodium: 135 mEq/L (ref 135–145)
Total Bilirubin: 0.6 mg/dL (ref 0.2–1.2)
Total Protein: 7.1 g/dL (ref 6.0–8.3)

## 2017-08-05 LAB — LIPID PANEL
Cholesterol: 160 mg/dL (ref 0–200)
HDL: 49.1 mg/dL (ref >39.00)
LDL CALC: 92 mg/dL (ref 0–99)
NonHDL: 110.45
TRIGLYCERIDES: 90 mg/dL (ref 0.0–149.0)
Total CHOL/HDL Ratio: 3
VLDL: 18 mg/dL (ref 0.0–40.0)

## 2017-08-05 NOTE — Progress Notes (Signed)
Pre visit review using our clinic review tool, if applicable. No additional management support is needed unless otherwise documented below in the visit note. 

## 2017-08-05 NOTE — Patient Instructions (Signed)
Ms. Plotner , Thank you for taking time to come for your Medicare Wellness Visit. I appreciate your ongoing commitment to your health goals. Please review the following plan we discussed and let me know if I can assist you in the future.   These are the goals we discussed: Goals    . Increase physical activity          Starting 08/05/2017, I will continue to walk at least 30 min 7 days per week.        This is a list of the screening recommended for you and due dates:  Health Maintenance  Topic Date Due  . Tetanus Vaccine  12/18/2025*  . Mammogram  09/23/2017  . Colon Cancer Screening  02/14/2018  . Flu Shot  Completed  . DEXA scan (bone density measurement)  Completed  .  Hepatitis C: One time screening is recommended by Center for Disease Control  (CDC) for  adults born from [birth year] through 1965.   Completed  . Pneumonia vaccines  Completed  *Topic was postponed. The date shown is not the original due date.   Preventive Care for Adults  A healthy lifestyle and preventive care can promote health and wellness. Preventive health guidelines for adults include the following key practices.  . A routine yearly physical is a good way to check with your health care provider about your health and preventive screening. It is a chance to share any concerns and updates on your health and to receive a thorough exam.  . Visit your dentist for a routine exam and preventive care every 6 months. Brush your teeth twice a day and floss once a day. Good oral hygiene prevents tooth decay and gum disease.  . The frequency of eye exams is based on your age, health, family medical history, use  of contact lenses, and other factors. Follow your health care provider's ecommendations for frequency of eye exams.  . Eat a healthy diet. Foods like vegetables, fruits, whole grains, low-fat dairy products, and lean protein foods contain the nutrients you need without too many calories. Decrease your intake of foods  high in solid fats, added sugars, and salt. Eat the right amount of calories for you. Get information about a proper diet from your health care provider, if necessary.  . Regular physical exercise is one of the most important things you can do for your health. Most adults should get at least 150 minutes of moderate-intensity exercise (any activity that increases your heart rate and causes you to sweat) each week. In addition, most adults need muscle-strengthening exercises on 2 or more days a week.  Silver Sneakers may be a benefit available to you. To determine eligibility, you may visit the website: www.silversneakers.com or contact program at (863) 350-8212 Mon-Fri between 8AM-8PM.   . Maintain a healthy weight. The body mass index (BMI) is a screening tool to identify possible weight problems. It provides an estimate of body fat based on height and weight. Your health care provider can find your BMI and can help you achieve or maintain a healthy weight.   For adults 20 years and older: ? A BMI below 18.5 is considered underweight. ? A BMI of 18.5 to 24.9 is normal. ? A BMI of 25 to 29.9 is considered overweight. ? A BMI of 30 and above is considered obese.   . Maintain normal blood lipids and cholesterol levels by exercising and minimizing your intake of saturated fat. Eat a balanced diet with plenty of fruit  and vegetables. Blood tests for lipids and cholesterol should begin at age 26 and be repeated every 5 years. If your lipid or cholesterol levels are high, you are over 50, or you are at high risk for heart disease, you may need your cholesterol levels checked more frequently. Ongoing high lipid and cholesterol levels should be treated with medicines if diet and exercise are not working.  . If you smoke, find out from your health care provider how to quit. If you do not use tobacco, please do not start.  . If you choose to drink alcohol, please do not consume more than 2 drinks per day.  One drink is considered to be 12 ounces (355 mL) of beer, 5 ounces (148 mL) of wine, or 1.5 ounces (44 mL) of liquor.  . If you are 67-33 years old, ask your health care provider if you should take aspirin to prevent strokes.  . Use sunscreen. Apply sunscreen liberally and repeatedly throughout the day. You should seek shade when your shadow is shorter than you. Protect yourself by wearing long sleeves, pants, a wide-brimmed hat, and sunglasses year round, whenever you are outdoors.  . Once a month, do a whole body skin exam, using a mirror to look at the skin on your back. Tell your health care provider of new moles, moles that have irregular borders, moles that are larger than a pencil eraser, or moles that have changed in shape or color.

## 2017-08-05 NOTE — Progress Notes (Signed)
I reviewed health advisor's note, was available for consultation, and agree with documentation and plan.  

## 2017-08-05 NOTE — Telephone Encounter (Signed)
-----   Message from Eustace Pen, LPN sent at 50/12/7046 12:27 PM EDT ----- Regarding: Labs 10/16 Lab orders needed. Thank you.  Insurance:  [employer]

## 2017-08-05 NOTE — Progress Notes (Signed)
PCP notes:   Health maintenance:  Flu vaccine - administered PPSV23 - administered Tetanus vaccine - postponed/insurance  Abnormal screenings:   None  Patient concerns:   None  Nurse concerns:  None  Next PCP appt:   10/19 @ 1400

## 2017-08-05 NOTE — Progress Notes (Signed)
Subjective:   Martha Hayes is a 66 y.o. female who presents for Medicare Annual (Subsequent) preventive examination.  Review of Systems:  N/A Cardiac Risk Factors include: advanced age (>45men, >72 women);dyslipidemia;hypertension     Objective:     Vitals: BP 94/62 (BP Location: Right Arm, Patient Position: Sitting, Cuff Size: Normal)   Pulse 66   Temp 98.1 F (36.7 C) (Oral)   Ht 5\' 2"  (1.575 m) Comment: no shoes  Wt 125 lb (56.7 kg)   SpO2 97%   BMI 22.86 kg/m   Body mass index is 22.86 kg/m.   Tobacco History  Smoking Status  . Never Smoker  Smokeless Tobacco  . Never Used     Counseling given: No   Past Medical History:  Diagnosis Date  . Anemia, unspecified    due to menorrhagia  . DDD (degenerative disc disease), lumbosacral    MRI lumbar 2003  . Hyperlipidemia   . Hypertension    Past Surgical History:  Procedure Laterality Date  . Whitney   right  . PARTIAL HYSTERECTOMY  1992   uterine fibroids  . TONSILLECTOMY  1959   Family History  Problem Relation Age of Onset  . Diabetes Father   . Hypertension Father   . Seizures Father        petit mal seizure disorder  . Heart disease Father        defibrillation  . Heart disease Mother        CAD, two stents  . Diabetes Mother        developed in 42's  . Osteoporosis Mother   . Hypertension Mother   . Heart disease Sister        pacemaker  . Sudden Cardiac Death Sister 20       aborted; had h/o bundle branch block   History  Sexual Activity  . Sexual activity: Not on file    Outpatient Encounter Prescriptions as of 08/05/2017  Medication Sig  . carvedilol (COREG) 3.125 MG tablet Take 1 tablet (3.125 mg total) by mouth 2 (two) times daily with a meal.  . lisinopril-hydrochlorothiazide (PRINZIDE,ZESTORETIC) 10-12.5 MG tablet Take 1 tablet by mouth daily.  . simvastatin (ZOCOR) 20 MG tablet TAKE 1 TABLET (20 MG TOTAL) BY MOUTH EVERY EVENING.   No facility-administered  encounter medications on file as of 08/05/2017.     Activities of Daily Living In your present state of health, do you have any difficulty performing the following activities: 08/05/2017  Hearing? N  Vision? N  Difficulty concentrating or making decisions? N  Walking or climbing stairs? N  Dressing or bathing? N  Doing errands, shopping? N  Preparing Food and eating ? N  Using the Toilet? N  In the past six months, have you accidently leaked urine? N  Do you have problems with loss of bowel control? N  Managing your Medications? N  Managing your Finances? N  Housekeeping or managing your Housekeeping? N  Some recent data might be hidden    Patient Care Team: Jinny Sanders, MD as PCP - General End, Harrell Gave, MD as Consulting Physician (Cardiology)    Assessment:     Hearing Screening   125Hz  250Hz  500Hz  1000Hz  2000Hz  3000Hz  4000Hz  6000Hz  8000Hz   Right ear:   40 40 40  40    Left ear:   40 40 40  40    Vision Screening Comments: Last vision exam in May 2018 with Dr. Wallace Going   Exercise Activities  and Dietary recommendations Current Exercise Habits: Home exercise routine, Type of exercise: walking, Time (Minutes): 30, Frequency (Times/Week): 7, Weekly Exercise (Minutes/Week): 210, Intensity: Moderate, Exercise limited by: None identified  Goals    . Increase physical activity          Starting 08/05/2017, I will continue to walk at least 30 min 7 days per week.       Fall Risk Fall Risk  08/05/2017 07/05/2016  Falls in the past year? No No   Depression Screen PHQ 2/9 Scores 08/05/2017 07/05/2016  PHQ - 2 Score 0 0  PHQ- 9 Score 0 -     Cognitive Function MMSE - Mini Mental State Exam 08/05/2017  Orientation to time 5  Orientation to Place 5  Registration 3  Attention/ Calculation 0  Recall 3  Language- name 2 objects 0  Language- repeat 1  Language- follow 3 step command 3  Language- read & follow direction 0  Write a sentence 0  Copy design 0    Total score 20     PLEASE NOTE: A Mini-Cog screen was completed. Maximum score is 20. A value of 0 denotes this part of Folstein MMSE was not completed or the patient failed this part of the Mini-Cog screening.   Mini-Cog Screening Orientation to Time - Max 5 pts Orientation to Place - Max 5 pts Registration - Max 3 pts Recall - Max 3 pts Language Repeat - Max 1 pts Language Follow 3 Step Command - Max 3 pts     Immunization History  Administered Date(s) Administered  . Influenza Whole 10/21/2005, 07/22/2007, 07/21/2009  . Influenza, Seasonal, Injecte, Preservative Fre 09/05/2015, 07/29/2016  . Influenza,inj,Quad PF,6+ Mos 08/05/2017  . Pneumococcal Conjugate-13 07/29/2016  . Pneumococcal Polysaccharide-23 08/05/2017  . Td 12/19/2005  . Zoster 04/15/2011   Screening Tests Health Maintenance  Topic Date Due  . TETANUS/TDAP  12/18/2025 (Originally 12/20/2015)  . MAMMOGRAM  09/23/2017  . COLONOSCOPY  02/14/2018  . INFLUENZA VACCINE  Completed  . DEXA SCAN  Completed  . Hepatitis C Screening  Completed  . PNA vac Low Risk Adult  Completed      Plan:     I have personally reviewed and addressed the Medicare Annual Wellness questionnaire and have noted the following in the patient's chart:  A. Medical and social history B. Use of alcohol, tobacco or illicit drugs  C. Current medications and supplements D. Functional ability and status E.  Nutritional status F.  Physical activity G. Advance directives H. List of other physicians I.  Hospitalizations, surgeries, and ER visits in previous 12 months J.  Miller to include hearing, vision, cognitive, depression L. Referrals and appointments - none  In addition, I have reviewed and discussed with patient certain preventive protocols, quality metrics, and best practice recommendations. A written personalized care plan for preventive services as well as general preventive health recommendations were provided to  patient.  See attached scanned questionnaire for additional information.   Signed,   Lindell Noe, MHA, BS, LPN Health Coach

## 2017-08-06 DIAGNOSIS — D0439 Carcinoma in situ of skin of other parts of face: Secondary | ICD-10-CM | POA: Diagnosis not present

## 2017-08-08 ENCOUNTER — Ambulatory Visit: Payer: Medicare Other | Admitting: Family Medicine

## 2017-08-29 ENCOUNTER — Encounter: Payer: Self-pay | Admitting: Family Medicine

## 2017-08-29 ENCOUNTER — Ambulatory Visit (INDEPENDENT_AMBULATORY_CARE_PROVIDER_SITE_OTHER): Payer: Medicare Other | Admitting: Family Medicine

## 2017-08-29 VITALS — BP 110/60 | HR 66 | Temp 97.5°F | Ht 62.0 in | Wt 127.2 lb

## 2017-08-29 DIAGNOSIS — Z0000 Encounter for general adult medical examination without abnormal findings: Secondary | ICD-10-CM | POA: Diagnosis not present

## 2017-08-29 DIAGNOSIS — E7800 Pure hypercholesterolemia, unspecified: Secondary | ICD-10-CM

## 2017-08-29 DIAGNOSIS — I10 Essential (primary) hypertension: Secondary | ICD-10-CM | POA: Diagnosis not present

## 2017-08-29 MED ORDER — LISINOPRIL-HYDROCHLOROTHIAZIDE 10-12.5 MG PO TABS: 1.0000 | ORAL_TABLET | Freq: Every day | ORAL | 3 refills | Status: DC

## 2017-08-29 MED ORDER — SIMVASTATIN 20 MG PO TABS: ORAL_TABLET | ORAL | 3 refills | Status: DC

## 2017-08-29 NOTE — Progress Notes (Signed)
Subjective:    Patient ID: Martha Hayes, female    DOB: 1951-06-27, 66 y.o.   MRN: [id number]  HPI  The patient presents for  complete physical and review of chronic health problems.   The patient saw Candis Musa, LPN for medicare wellness. Note reviewed in detail and important notes copied below. Health maintenance: Flu vaccine - administered PPSV23 - administered Tetanus vaccine - postponed/insurance Abnormal screenings:  None  Hypertension:   Good control on lisinopril htcz, coreg.  BP Readings from Last 3 Encounters:  08/29/17 110/60  08/05/17 94/62  12/04/16 122/60  Using medication without problems or lightheadedness:  none Chest pain with exertion: None Edema:none Short of breath: none Average home BPs: 110s/70s Other issues:  Elevated Cholesterol:  LDL at goal on simvastatin. Lab Results  Component Value Date   CHOL 160 08/05/2017   HDL 49.10 08/05/2017   LDLCALC 92 08/05/2017   LDLDIRECT 153.4 05/07/2012   TRIG 90.0 08/05/2017   CHOLHDL 3 08/05/2017  Using medications without problems: Muscle aches:  Diet compliance:  healthy Exercise: walking 3 times a week Other complaints:   Social History /Family History/Past Medical History reviewed in detail and updated in EMR if needed. Blood pressure 110/60, pulse 66, temperature (!) 97.5 F (36.4 C), temperature source Oral, height 5\' 2"  (1.575 m), weight 127 lb 4 oz (57.7 kg).  Body mass index is 23.27 kg/m.   Review of Systems  Constitutional: Negative for fatigue and fever.  HENT: Negative for congestion.   Eyes: Negative for pain.  Respiratory: Negative for cough and shortness of breath.   Cardiovascular: Negative for chest pain, palpitations and leg swelling.  Gastrointestinal: Negative for abdominal pain.  Genitourinary: Negative for dysuria and vaginal bleeding.  Musculoskeletal: Negative for back pain.  Neurological: Negative for syncope, light-headedness and headaches.    Psychiatric/Behavioral: Negative for dysphoric mood.       Objective:   Physical Exam  Constitutional: Vital signs are normal. She appears well-developed and well-nourished. She is cooperative.  Non-toxic appearance. She does not appear ill. No distress.  HENT:  Head: Normocephalic.  Right Ear: Hearing, tympanic membrane, external ear and ear canal normal.  Left Ear: Hearing, tympanic membrane, external ear and ear canal normal.  Nose: Nose normal.  Eyes: Conjunctivae, EOM and lids are normal. Pupils are equal, round, and reactive to light. Lids are everted and swept, no foreign bodies found.  Neck: Trachea normal and normal range of motion. Neck supple. Carotid bruit is not present. No thyroid mass and no thyromegaly present.  Cardiovascular: Normal rate, regular rhythm, S1 normal, S2 normal, normal heart sounds and intact distal pulses. Exam reveals no gallop.  No murmur heard. Pulmonary/Chest: Effort normal and breath sounds normal. No respiratory distress. She has no wheezes. She has no rhonchi. She has no rales.  Abdominal: Soft. Normal appearance and bowel sounds are normal. She exhibits no distension, no fluid wave, no abdominal bruit and no mass. There is no hepatosplenomegaly. There is no tenderness. There is no rebound, no guarding and no CVA tenderness. No hernia.  Lymphadenopathy:    She has no cervical adenopathy.    She has no axillary adenopathy.  Neurological: She is alert. She has normal strength. No cranial nerve deficit or sensory deficit.  Skin: Skin is warm, dry and intact. No rash noted.  Psychiatric: Her speech is normal and behavior is normal. Judgment normal. Her mood appears not anxious. Cognition and memory are normal. She does not exhibit a depressed mood.  Assessment & Plan:  The patient's preventative maintenance and recommended screening tests for an annual wellness exam were reviewed in full today. Brought up to date unless services  declined.  Counselled on the importance of diet, exercise, and its role in overall health and mortality. The patient's FH and SH was reviewed, including their home life, tobacco status, and drug and alcohol status.   Vaccines: Uptodate zoster, flu, PCV. Consider Td. PAP/DVE: partial hysterectomy,  No family history of ovarian cancerDVE not indicated. Mammo: stable 03/2017, pt wishes to do MBE yearly. Colonoscopy: Dr. Watt Climes Date: 02/15/2008, Next Due: 02/2018 Results: Diverticulosis  DEXA: 2010.. Osteopenia.Marland Kitchen Has not been taking ca and vit D. Does not EVER want to do DEXA, would not use meds to treat.  Vit D in nml range. HEP C screen: done HIV screen: refused nonsmoker

## 2017-08-29 NOTE — Patient Instructions (Signed)
Keep up great work on healthy lifestyle!

## 2017-09-08 DIAGNOSIS — Z029 Encounter for administrative examinations, unspecified: Secondary | ICD-10-CM | POA: Diagnosis not present

## 2018-01-21 ENCOUNTER — Encounter: Payer: Self-pay | Admitting: Internal Medicine

## 2018-01-21 ENCOUNTER — Ambulatory Visit (INDEPENDENT_AMBULATORY_CARE_PROVIDER_SITE_OTHER): Payer: Medicare Other | Admitting: Internal Medicine

## 2018-01-21 VITALS — BP 118/70 | HR 80 | Ht 62.0 in | Wt 126.5 lb

## 2018-01-21 DIAGNOSIS — I10 Essential (primary) hypertension: Secondary | ICD-10-CM | POA: Diagnosis not present

## 2018-01-21 DIAGNOSIS — I471 Supraventricular tachycardia: Secondary | ICD-10-CM

## 2018-01-21 MED ORDER — CARVEDILOL 3.125 MG PO TABS: 3.1250 mg | ORAL_TABLET | Freq: Two times a day (BID) | ORAL | 3 refills | Status: DC

## 2018-01-21 NOTE — Progress Notes (Signed)
Follow-up Outpatient Visit Date: 01/21/2018  Primary Care Provider: Jinny Sanders, MD [place] [address]  Chief Complaint: Follow-up palpitations  HPI:  Martha Hayes is a 67 y.o. year-old female with history of PSVT, hypertension, and hyperlipidemia, who presents for follow-up of palpitations.  I last saw Martha Hayes a year ago, at which time she was doing well after addition of low-dose carvedilol. She notes only brief rare palpitations since our last visit. She otherwise has felt well, denying chest pain, shortness of breath, lightheadedness, and edema. She is tolerating carvedilol well. Home BP is typically 130/80. She walks 2 mi/day, as the weather allows. She drinks 2 cups of coffee/day and 1 glass of wine most evenings.  --------------------------------------------------------------------------------------------------  Cardiovascular History & Procedures: Cardiovascular Problems:  Paroxysmal supraventricular tachycardia  Risk Factors:  None  Cath/PCI:  None  CV Surgery:  None  EP Procedures and Devices:  14-day event monitor (07/31/16): Predominant rhythm was sinus with average rate 75 bpm (range 50-153 bpm). Rare isolated ventricular and supraventricular ectopy was noted. There were 2 episodes of SVT lasting 10 beats with a maximum rate of 164 bpm. Morphology is suggestive of atrial tachycardia. Patient reported symptoms correspond to sinus rhythm with isolated PVCs.   Recent CV Pertinent Labs: Lab Results  Component Value Date   CHOL 160 08/05/2017   HDL 49.10 08/05/2017   LDLCALC 92 08/05/2017   LDLDIRECT 153.4 05/07/2012   TRIG 90.0 08/05/2017   CHOLHDL 3 08/05/2017   K 4.0 08/05/2017   BUN 18 08/05/2017   CREATININE 0.73 08/05/2017   CREATININE 0.73 06/23/2015    Past medical and surgical history were reviewed and updated in EPIC.  Current Meds  Medication Sig  . carvedilol (COREG) 3.125 MG tablet Take 1 tablet (3.125 mg  total) by mouth 2 (two) times daily with a meal.  . lisinopril-hydrochlorothiazide (PRINZIDE,ZESTORETIC) 10-12.5 MG tablet Take 1 tablet daily by mouth.  . simvastatin (ZOCOR) 20 MG tablet TAKE 1 TABLET (20 MG TOTAL) BY MOUTH EVERY EVENING.  . [DISCONTINUED] carvedilol (COREG) 3.125 MG tablet Take 1 tablet (3.125 mg total) by mouth 2 (two) times daily with a meal.    Allergies: Penicillins  Social History   Tobacco Use  . Smoking status: Never Smoker  . Smokeless tobacco: Never Used  Substance and Sexual Activity  . Alcohol use: Yes    Alcohol/week: 3.0 oz    Types: 5 Glasses of wine per week  . Drug use: No    Family History  Problem Relation Age of Onset  . Diabetes Father   . Hypertension Father   . Seizures Father        petit mal seizure disorder  . Heart disease Father        defibrillation  . Heart disease Mother        CAD, two stents  . Diabetes Mother        developed in 74's  . Osteoporosis Mother   . Hypertension Mother   . Heart disease Sister        pacemaker  . Sudden Cardiac Death Sister 60       aborted; had h/o bundle branch block    Review of Systems: A 12-system review of systems was performed and was negative except as noted in the HPI.  --------------------------------------------------------------------------------------------------  Physical Exam: BP 118/70 (BP Location: Left Arm, Patient Position: Sitting, Cuff Size: Normal)   Pulse 80   Ht 5\' 2"  (1.575 m)  Wt 126 lb 8 oz (57.4 kg)   BMI 23.14 kg/m   General:  NAD HEENT: No conjunctival pallor or scleral icterus. Moist mucous membranes.  OP clear. Neck: Supple without lymphadenopathy, thyromegaly, JVD, or HJR. Lungs: Normal work of breathing. Clear to auscultation bilaterally without wheezes or crackles. Heart: Regular rate and rhythm without murmurs, rubs, or gallops. Non-displaced PMI. Abd: Bowel sounds present. Soft, NT/ND without hepatosplenomegaly Ext: No lower extremity edema.  Radial, PT, and DP pulses are 2+ bilaterally.  EKG:  NSR without abnormalities.  Lab Results  Component Value Date   WBC 7.9 07/23/2016   HGB 14.0 07/23/2016   HCT 41.6 07/23/2016   MCV 88.0 07/23/2016   PLT 333.0 07/23/2016    Lab Results  Component Value Date   NA 135 08/05/2017   K 4.0 08/05/2017   CL 101 08/05/2017   CO2 25 08/05/2017   BUN 18 08/05/2017   CREATININE 0.73 08/05/2017   GLUCOSE 95 08/05/2017   ALT 20 08/05/2017    Lab Results  Component Value Date   CHOL 160 08/05/2017   HDL 49.10 08/05/2017   LDLCALC 92 08/05/2017   LDLDIRECT 153.4 05/07/2012   TRIG 90.0 08/05/2017   CHOLHDL 3 08/05/2017    --------------------------------------------------------------------------------------------------  ASSESSMENT AND PLAN: PSVT Martha Hayes reports rare, brief palpitations that could represent brief runs of SVT or isolated PAC's/PVC's. She is tolerating low-dose carvedilol well, which we will continue indefinitely. If her palpitations worsen, I encouraged Martha Hayes to try cutting down on her caffeine intake.  Hypertension BP normal today. Continue carvedilol and lisinopril/HCTZ; follow-up with Dr. Diona Browner.  Follow-up: Return to clinic as needed.  [name], MD 01/21/2018 2:01 PM

## 2018-01-21 NOTE — Patient Instructions (Signed)
Medication Instructions:  Your physician recommends that you continue on your current medications as directed. Please refer to the Current Medication list given to you today.   Labwork: none  Testing/Procedures: none  Follow-Up: Your physician recommends that you schedule a follow-up appointment in: as needed.   If you need a refill on your cardiac medications before your next appointment, please call your pharmacy.

## 2018-03-04 ENCOUNTER — Other Ambulatory Visit: Payer: Self-pay | Admitting: Family Medicine

## 2018-03-04 DIAGNOSIS — Z1231 Encounter for screening mammogram for malignant neoplasm of breast: Secondary | ICD-10-CM

## 2018-03-06 DIAGNOSIS — K5730 Diverticulosis of large intestine without perforation or abscess without bleeding: Secondary | ICD-10-CM | POA: Diagnosis not present

## 2018-03-06 DIAGNOSIS — Z1211 Encounter for screening for malignant neoplasm of colon: Secondary | ICD-10-CM | POA: Diagnosis not present

## 2018-03-06 LAB — HM COLONOSCOPY

## 2018-03-17 ENCOUNTER — Encounter: Payer: Self-pay | Admitting: Family Medicine

## 2018-03-26 ENCOUNTER — Ambulatory Visit
Admission: RE | Admit: 2018-03-26 | Discharge: 2018-03-26 | Disposition: A | Discharge status: Home / Self Care | Payer: Medicare Other | Source: Ambulatory Visit | Attending: Family Medicine | Admitting: Family Medicine

## 2018-03-26 DIAGNOSIS — Z1231 Encounter for screening mammogram for malignant neoplasm of breast: Secondary | ICD-10-CM

## 2018-03-27 ENCOUNTER — Other Ambulatory Visit: Payer: Self-pay | Admitting: Family Medicine

## 2018-03-27 DIAGNOSIS — R928 Other abnormal and inconclusive findings on diagnostic imaging of breast: Secondary | ICD-10-CM

## 2018-03-31 ENCOUNTER — Ambulatory Visit: Payer: Medicare Other

## 2018-03-31 ENCOUNTER — Ambulatory Visit
Admission: RE | Admit: 2018-03-31 | Discharge: 2018-03-31 | Disposition: A | Discharge status: Home / Self Care | Payer: Medicare Other | Source: Ambulatory Visit | Attending: Family Medicine | Admitting: Family Medicine

## 2018-03-31 DIAGNOSIS — R928 Other abnormal and inconclusive findings on diagnostic imaging of breast: Secondary | ICD-10-CM

## 2018-06-16 DIAGNOSIS — H2513 Age-related nuclear cataract, bilateral: Secondary | ICD-10-CM | POA: Diagnosis not present

## 2018-08-03 DIAGNOSIS — D229 Melanocytic nevi, unspecified: Secondary | ICD-10-CM | POA: Diagnosis not present

## 2018-08-03 DIAGNOSIS — L570 Actinic keratosis: Secondary | ICD-10-CM | POA: Diagnosis not present

## 2018-08-25 ENCOUNTER — Telehealth: Payer: Self-pay | Admitting: Family Medicine

## 2018-08-25 ENCOUNTER — Other Ambulatory Visit (INDEPENDENT_AMBULATORY_CARE_PROVIDER_SITE_OTHER): Payer: Medicare Other

## 2018-08-25 DIAGNOSIS — E7800 Pure hypercholesterolemia, unspecified: Secondary | ICD-10-CM

## 2018-08-25 DIAGNOSIS — E559 Vitamin D deficiency, unspecified: Secondary | ICD-10-CM

## 2018-08-25 DIAGNOSIS — M8580 Other specified disorders of bone density and structure, unspecified site: Secondary | ICD-10-CM

## 2018-08-25 NOTE — Telephone Encounter (Signed)
-----   Message from Ellamae Sia sent at 08/24/2018 11:06 AM EST ----- Regarding: Lab orders for Tuesday, 11.5.19 Patient is scheduled for CPX labs, please order future labs, Thanks , Karna Christmas

## 2018-08-25 NOTE — Addendum Note (Signed)
Addended by: Ellamae Sia on: 08/25/2018 09:31 AM   Modules accepted: Orders

## 2018-08-26 LAB — COMPREHENSIVE METABOLIC PANEL
AG RATIO: 1.7 (calc) (ref 1.0–2.5)
ALT: 23 U/L (ref 6–29)
AST: 22 U/L (ref 10–35)
Albumin: 4.6 g/dL (ref 3.6–5.1)
Alkaline phosphatase (APISO): 104 U/L (ref 33–130)
BUN: 12 mg/dL (ref 7–25)
CHLORIDE: 103 mmol/L (ref 98–110)
CO2: 27 mmol/L (ref 20–32)
Calcium: 9.7 mg/dL (ref 8.6–10.4)
Creat: 0.75 mg/dL (ref 0.50–0.99)
GLOBULIN: 2.7 g/dL (ref 1.9–3.7)
GLUCOSE: 100 mg/dL — AB (ref 65–99)
Potassium: 4.5 mmol/L (ref 3.5–5.3)
Sodium: 140 mmol/L (ref 135–146)
TOTAL PROTEIN: 7.3 g/dL (ref 6.1–8.1)
Total Bilirubin: 0.5 mg/dL (ref 0.2–1.2)

## 2018-08-26 LAB — LIPID PANEL
Cholesterol: 200 mg/dL — ABNORMAL HIGH (ref <200)
HDL: 52 mg/dL (ref >50)
LDL Cholesterol (Calc): 118 mg/dL (calc) — ABNORMAL HIGH
Non-HDL Cholesterol (Calc): 148 mg/dL (calc) — ABNORMAL HIGH (ref <130)
TRIGLYCERIDES: 188 mg/dL — AB (ref <150)
Total CHOL/HDL Ratio: 3.8 (calc) (ref <5.0)

## 2018-08-26 LAB — VITAMIN D 25 HYDROXY (VIT D DEFICIENCY, FRACTURES): Vit D, 25-Hydroxy: 32 ng/mL (ref 30–100)

## 2018-08-27 ENCOUNTER — Other Ambulatory Visit: Payer: Self-pay | Admitting: *Deleted

## 2018-08-27 ENCOUNTER — Telehealth: Payer: Self-pay | Admitting: Family Medicine

## 2018-08-27 ENCOUNTER — Other Ambulatory Visit: Payer: Medicare Other

## 2018-08-27 ENCOUNTER — Ambulatory Visit: Payer: Medicare Other

## 2018-08-27 DIAGNOSIS — M8580 Other specified disorders of bone density and structure, unspecified site: Secondary | ICD-10-CM

## 2018-08-27 DIAGNOSIS — E559 Vitamin D deficiency, unspecified: Secondary | ICD-10-CM

## 2018-08-27 DIAGNOSIS — E7800 Pure hypercholesterolemia, unspecified: Secondary | ICD-10-CM

## 2018-08-27 NOTE — Telephone Encounter (Signed)
-----   Message from Ellamae Sia sent at 08/18/2018  8:55 AM EDT ----- Regarding: Lab orders for Thursday, 11.7.19 Patient is scheduled for CPX labs, please order future labs, Thanks , Karna Christmas

## 2018-09-01 ENCOUNTER — Ambulatory Visit (INDEPENDENT_AMBULATORY_CARE_PROVIDER_SITE_OTHER): Payer: Medicare Other | Admitting: Family Medicine

## 2018-09-01 ENCOUNTER — Encounter: Payer: Self-pay | Admitting: Family Medicine

## 2018-09-01 VITALS — BP 124/62 | HR 69 | Temp 97.7°F | Ht 62.0 in | Wt 127.0 lb

## 2018-09-01 DIAGNOSIS — Z0000 Encounter for general adult medical examination without abnormal findings: Secondary | ICD-10-CM

## 2018-09-01 DIAGNOSIS — I10 Essential (primary) hypertension: Secondary | ICD-10-CM | POA: Diagnosis not present

## 2018-09-01 DIAGNOSIS — E7800 Pure hypercholesterolemia, unspecified: Secondary | ICD-10-CM

## 2018-09-01 NOTE — Assessment & Plan Note (Signed)
Well controlled. Continue current medication. Encouraged exercise, weight loss, healthy eating habits.  

## 2018-09-01 NOTE — Patient Instructions (Addendum)
Get back on track with low cholesterol diet. Keep up with regular exercise.

## 2018-09-01 NOTE — Assessment & Plan Note (Signed)
Worsened control Still on statin. Continue and get back on track with diet changes.

## 2018-09-01 NOTE — Progress Notes (Signed)
Subjective:    Patient ID: Martha Hayes, female    DOB: 01/08/1951, 67 y.o.   MRN: [id number]  HPI   The patient presents for annual medicare wellness, complete physical and review of chronic health problems. He/She also has the following acute concerns today: None  I have personally reviewed the Medicare Annual Wellness questionnaire and have noted 1. The patient's medical and social history 2. Their use of alcohol, tobacco or illicit drugs 3. Their current medications and supplements 4. The patient's functional ability including ADL's, fall risks, home safety risks and hearing or visual             impairment. 5. Diet and physical activities 6. Evidence for depression or mood disorders 7.         Updated provider list. Cognitive evaluation was performed and recorded on pt medicare questionnaire form. The patients weight, height, BMI and visual acuity have been recorded in the chart  I have made referrals, counseling and provided education to the patient based review of the above and I have provided the pt with a written personalized care plan for preventive services.   Documentation of this information was scanned into the electronic record under the media tab.   She got a flu shot 1 week ago at [facility]... 3 hours after she had severe soreness in shoulder joint.  In 24 hours she could not raise her arm. No redness no swelling.  Has improved over time.  Hypertension:  Good control on lisinopril htcz, coreg.  BP Readings from Last 3 Encounters:  09/01/18 124/62  01/21/18 118/70  08/29/17 110/60  Using medication without problems or lightheadedness:  none Chest pain with exertion: none Edema:none Short of breath: none Average home BPs: good Other issues:  Elevated Cholesterol:  LDL higher than last year.. On simvastatin, but stopped oatmeal. Lab Results  Component Value Date   CHOL 200 (H) 08/25/2018   HDL 52 08/25/2018   LDLCALC 118 (H) 08/25/2018   LDLDIRECT 153.4  05/07/2012   TRIG 188 (H) 08/25/2018   CHOLHDL 3.8 08/25/2018  Using medications without problems: Muscle aches:  Diet compliance: good Exercise: walking 3-4 days a week Other complaints:    Hearing Screening   Method: Audiometry   125Hz  250Hz  500Hz  1000Hz  2000Hz  3000Hz  4000Hz  6000Hz  8000Hz   Right ear:   20 20 20  20     Left ear:   20 20 20  20     Vision Screening Comments: Eye Exam with Dr. Murvin Natal 05/2018  Fall Risk  09/01/2018 08/05/2017 07/05/2016  Falls in the past year? 0 No No   Depression screen [facility] 2/9 09/01/2018 08/05/2017 07/05/2016  Decreased Interest 0 0 0  Down, Depressed, Hopeless 0 0 0  PHQ - 2 Score 0 0 0  Altered sleeping - 0 -  Tired, decreased energy - 0 -  Change in appetite - 0 -  Feeling bad or failure about yourself  - 0 -  Trouble concentrating - 0 -  Moving slowly or fidgety/restless - 0 -  Suicidal thoughts - 0 -  PHQ-9 Score - 0 -  Difficult doing work/chores - Not difficult at all -      Advance directives and end of life planning reviewed in detail with patient and documented in EMR. Patient given handout on advance care directives if needed. HCPOA and living will updated if needed.  Social History /Family History/Past Medical History reviewed in detail and updated in EMR if needed. Blood pressure 124/62, pulse 69,  temperature 97.7 F (36.5 C), temperature source Oral, height 5\' 2"  (1.575 m), weight 127 lb (57.6 kg).  Body mass index is 23.23 kg/m.   Review of Systems  Constitutional: Negative for fatigue and fever.  HENT: Negative for congestion.   Eyes: Negative for pain.  Respiratory: Negative for cough and shortness of breath.   Cardiovascular: Negative for chest pain, palpitations and leg swelling.  Gastrointestinal: Negative for abdominal pain.  Genitourinary: Negative for dysuria and vaginal bleeding.  Musculoskeletal: Negative for back pain.  Neurological: Negative for syncope, light-headedness and headaches.    Psychiatric/Behavioral: Negative for dysphoric mood.       Objective:   Physical Exam  Constitutional: Vital signs are normal. She appears well-developed and well-nourished. She is cooperative.  Non-toxic appearance. She does not appear ill. No distress.  HENT:  Head: Normocephalic.  Right Ear: Hearing, tympanic membrane, external ear and ear canal normal.  Left Ear: Hearing, tympanic membrane, external ear and ear canal normal.  Nose: Nose normal.  Eyes: Pupils are equal, round, and reactive to light. Conjunctivae, EOM and lids are normal. Lids are everted and swept, no foreign bodies found.  Neck: Trachea normal and normal range of motion. Neck supple. Carotid bruit is not present. No thyroid mass and no thyromegaly present.  Cardiovascular: Normal rate, regular rhythm, S1 normal, S2 normal, normal heart sounds and intact distal pulses. Exam reveals no gallop.  No murmur heard. Pulmonary/Chest: Effort normal and breath sounds normal. No respiratory distress. She has no wheezes. She has no rhonchi. She has no rales.  Abdominal: Soft. Normal appearance and bowel sounds are normal. She exhibits no distension, no fluid wave, no abdominal bruit and no mass. There is no hepatosplenomegaly. There is no tenderness. There is no rebound, no guarding and no CVA tenderness. No hernia.  Lymphadenopathy:    She has no cervical adenopathy.    She has no axillary adenopathy.  Neurological: She is alert. She has normal strength. No cranial nerve deficit or sensory deficit.  Skin: Skin is warm, dry and intact. No rash noted.  Psychiatric: Her speech is normal and behavior is normal. Judgment normal. Her mood appears not anxious. Cognition and memory are normal. She does not exhibit a depressed mood.          Assessment & Plan:  The patient's preventative maintenance and recommended screening tests for an annual wellness exam were reviewed in full today. Brought up to date unless services  declined.  Counselled on the importance of diet, exercise, and its role in overall health and mortality. The patient's FH and SH was reviewed, including their home life, tobacco status, and drug and alcohol status.   Vaccines: Uptodate zoster, flu, PCV. Consider Td. PAP/DVE: partial hysterectomy,  No family history of ovarian cancerDVE not indicated. Mammo: stable6/2019, pt wishes to do MBE yearly. Colonoscopy: Dr. Watt Climes Date:  02/2018 Results: Diverticulosis , hemorrhoids, repeat in 10 years DEXA: 2010.. Osteopenia.Marland Kitchen Has not been taking ca and vit D. Does not EVER want to do DEXA, would not use meds to treat.  Vit D in nml range. HEP C screen:done HIV screen:refused nonsmoker

## 2018-09-03 ENCOUNTER — Ambulatory Visit: Payer: Medicare Other

## 2018-09-10 ENCOUNTER — Other Ambulatory Visit: Payer: Self-pay

## 2018-09-21 ENCOUNTER — Other Ambulatory Visit: Payer: Self-pay | Admitting: Family Medicine

## 2018-10-21 ENCOUNTER — Other Ambulatory Visit: Payer: Self-pay | Admitting: Family Medicine

## 2018-11-10 DIAGNOSIS — R69 Illness, unspecified: Secondary | ICD-10-CM | POA: Diagnosis not present

## 2019-01-20 ENCOUNTER — Telehealth: Payer: Self-pay | Admitting: *Deleted

## 2019-01-20 MED ORDER — LISINOPRIL 10 MG PO TABS: 10.0000 mg | ORAL_TABLET | Freq: Every day | ORAL | 1 refills | Status: DC

## 2019-01-20 MED ORDER — HYDROCHLOROTHIAZIDE 12.5 MG PO CAPS: 12.5000 mg | ORAL_CAPSULE | Freq: Every day | ORAL | 1 refills | Status: DC

## 2019-01-20 NOTE — Telephone Encounter (Signed)
Received fax from [facility] stating Lisinopril-HCTZ 10-12.5 mg is on backorder.  Asking for separate prescriptions.  Prescriptions sent as requested.

## 2019-02-19 ENCOUNTER — Other Ambulatory Visit: Payer: Self-pay | Admitting: Family Medicine

## 2019-02-19 DIAGNOSIS — Z1231 Encounter for screening mammogram for malignant neoplasm of breast: Secondary | ICD-10-CM

## 2019-03-17 DIAGNOSIS — L570 Actinic keratosis: Secondary | ICD-10-CM | POA: Diagnosis not present

## 2019-03-17 DIAGNOSIS — D485 Neoplasm of uncertain behavior of skin: Secondary | ICD-10-CM | POA: Diagnosis not present

## 2019-04-16 ENCOUNTER — Ambulatory Visit: Payer: Medicare Other

## 2019-04-26 ENCOUNTER — Ambulatory Visit
Admission: RE | Admit: 2019-04-26 | Discharge: 2019-04-26 | Disposition: A | Discharge status: Home / Self Care | Payer: Medicare HMO | Source: Ambulatory Visit | Attending: Family Medicine | Admitting: Family Medicine

## 2019-04-26 DIAGNOSIS — Z1231 Encounter for screening mammogram for malignant neoplasm of breast: Secondary | ICD-10-CM | POA: Diagnosis not present

## 2019-05-27 ENCOUNTER — Other Ambulatory Visit: Payer: Self-pay | Admitting: Family Medicine

## 2019-05-27 MED ORDER — CARVEDILOL 3.125 MG PO TABS: 3.1250 mg | ORAL_TABLET | Freq: Two times a day (BID) | ORAL | 3 refills | Status: DC

## 2019-05-27 NOTE — Telephone Encounter (Signed)
Dr. Diona Browner see prev note, is it okay to starting filling med, last filled by Dr. Saunders Revel

## 2019-05-27 NOTE — Telephone Encounter (Signed)
Pt is requesting a refill of her Carvedilol 3.125mg , 1 BID Last seen by Cards and was advised that he would not need to follow up with her unless having issues and PCP can do all future refills. Please advise. Thanks.   [id number] - [address], [place] [address] [phone]

## 2019-07-05 ENCOUNTER — Ambulatory Visit: Payer: Medicare HMO | Admitting: Podiatry

## 2019-08-02 ENCOUNTER — Other Ambulatory Visit: Payer: Self-pay | Admitting: Family Medicine

## 2019-08-03 DIAGNOSIS — H2513 Age-related nuclear cataract, bilateral: Secondary | ICD-10-CM | POA: Diagnosis not present

## 2019-08-12 ENCOUNTER — Ambulatory Visit (INDEPENDENT_AMBULATORY_CARE_PROVIDER_SITE_OTHER): Payer: Medicare HMO

## 2019-08-12 DIAGNOSIS — Z23 Encounter for immunization: Secondary | ICD-10-CM

## 2019-08-19 ENCOUNTER — Other Ambulatory Visit: Payer: Self-pay

## 2019-08-19 ENCOUNTER — Ambulatory Visit (INDEPENDENT_AMBULATORY_CARE_PROVIDER_SITE_OTHER): Payer: Medicare HMO | Admitting: Internal Medicine

## 2019-08-19 ENCOUNTER — Encounter: Payer: Self-pay | Admitting: Internal Medicine

## 2019-08-19 VITALS — BP 138/58 | HR 70 | Ht 62.0 in | Wt 123.5 lb

## 2019-08-19 DIAGNOSIS — R002 Palpitations: Secondary | ICD-10-CM | POA: Insufficient documentation

## 2019-08-19 DIAGNOSIS — E782 Mixed hyperlipidemia: Secondary | ICD-10-CM | POA: Diagnosis not present

## 2019-08-19 DIAGNOSIS — I10 Essential (primary) hypertension: Secondary | ICD-10-CM | POA: Diagnosis not present

## 2019-08-19 MED ORDER — CARVEDILOL 6.25 MG PO TABS: 3.1250 mg | ORAL_TABLET | Freq: Two times a day (BID) | ORAL | 2 refills | Status: DC

## 2019-08-19 NOTE — Patient Instructions (Signed)
Medication Instructions:  Your physician has recommended you make the following change in your medication:  1- INCREASE Carvedilol to 6.25 mg by mouth two times a day.  *If you need a refill on your cardiac medications before your next appointment, please call your pharmacy*  Lab Work: Your physician recommends that you return for lab work in: TODAY - BMP, TSH, Mg.  If you have labs (blood work) drawn today and your tests are completely normal, you will receive your results only by: Marland Kitchen MyChart Message (if you have MyChart) OR . A paper copy in the mail If you have any lab test that is abnormal or we need to change your treatment, we will call you to review the results.  Testing/Procedures: none  Follow-Up: At [facility], you and your health needs are our priority.  As part of our continuing mission to provide you with exceptional heart care, we have created designated Provider Care Teams.  These Care Teams include your primary Cardiologist (physician) and Advanced Practice Providers (APPs -  Physician Assistants and Nurse Practitioners) who all work together to provide you with the care you need, when you need it.  Your next appointment:   1 MONTH.  The format for your next appointment:   In Person  Provider:    You may see DR Harrell Gave END or one of the following Advanced Practice Providers on your designated Care Team:    Murray Hodgkins, NP  Christell Faith, PA-C  Marrianne Mood, PA-C

## 2019-08-19 NOTE — Progress Notes (Signed)
Follow-up Outpatient Visit Date: 08/19/2019  Primary Care Provider: Jinny Sanders, MD [place] [address]  Chief Complaint: Palpitations  HPI:  Martha Hayes is a 68 y.o. year-old female with history of PSVT, hypertension, and hyperlipidemia, who presents for follow-up of PSVT.  I last saw Martha Hayes in 01/2018, at which time she was doing well.  Today, Martha Hayes reports that over the last 2 to 3 months, she has felt more intermittent skipped/flops in her chest.  These are associated with lightheadedness and different than vertigo she has experienced in the past.  The palpitations are similar to what she had previously seen [place] for.  She notes that symptoms had improved with carvedilol up until recently.  She denies chest pain, shortness of breath, edema, and syncope.  She has been walking 4 miles a day without difficulty.  Blood pressure has been around 140/80.  She consumes 2 cups of coffee per day and 1 glass of wine most evenings.  --------------------------------------------------------------------------------------------------  Cardiovascular History & Procedures: Cardiovascular Problems:  Palpitations with paroxysmal supraventricular tachycardia  Risk Factors:  None  Cath/PCI:  None  CV Surgery:  None  EP Procedures and Devices:  14-day event monitor (07/31/16): Predominant rhythm was sinus with average rate 75 bpm (range 50-153 bpm). Rare isolated ventricular and supraventricular ectopy was noted. There were 2 episodes of SVT lasting 10 beats with a maximum rate of 164 bpm. Morphology is suggestive of atrial tachycardia. Patient reported symptoms correspond to sinus rhythm with isolated PVCs.  Recent CV Pertinent Labs: Lab Results  Component Value Date   CHOL 200 (H) 08/25/2018   HDL 52 08/25/2018   LDLCALC 118 (H) 08/25/2018   LDLDIRECT 153.4 05/07/2012   TRIG 188 (H) 08/25/2018   CHOLHDL 3.8 08/25/2018   K 4.5 08/25/2018   BUN 12  08/25/2018   CREATININE 0.75 08/25/2018    Past medical and surgical history were reviewed and updated in EPIC.  Current Meds  Medication Sig  . carvedilol (COREG) 3.125 MG tablet Take 1 tablet (3.125 mg total) by mouth 2 (two) times daily with a meal.  . hydrochlorothiazide (MICROZIDE) 12.5 MG capsule TAKE 1 CAPSULE BY MOUTH EVERY DAY  . lisinopril (ZESTRIL) 10 MG tablet TAKE 1 TABLET BY MOUTH EVERY DAY  . simvastatin (ZOCOR) 20 MG tablet TAKE 1 TABLET BY MOUTH EVERY DAY IN THE EVENING    Allergies: Penicillins  Social History   Tobacco Use  . Smoking status: Never Smoker  . Smokeless tobacco: Never Used  Substance Use Topics  . Alcohol use: Yes    Alcohol/week: 5.0 standard drinks    Types: 5 Glasses of wine per week  . Drug use: No    Family History  Problem Relation Age of Onset  . Diabetes Father   . Hypertension Father   . Seizures Father        petit mal seizure disorder  . Heart disease Father        defibrillation  . Heart disease Mother        CAD, two stents  . Diabetes Mother        developed in 44's  . Osteoporosis Mother   . Hypertension Mother   . Heart disease Sister        pacemaker  . Sudden Cardiac Death Sister 13       aborted; had h/o bundle branch block    Review of Systems: A 12-system review of systems was performed and  was negative except as noted in the HPI.  --------------------------------------------------------------------------------------------------  Physical Exam: BP (!) 138/58 (BP Location: Left Arm, Patient Position: Sitting, Cuff Size: Normal)   Pulse 70   Ht 5\' 2"  (1.575 m)   Wt 123 lb 8 oz (56 kg)   SpO2 99%   BMI 22.59 kg/m   General: NAD. HEENT: No conjunctival pallor or scleral icterus. Neck: Supple without lymphadenopathy, thyromegaly, JVD, or HJR. No carotid bruit. Lungs: Normal work of breathing. Clear to auscultation bilaterally without wheezes or crackles. Heart: Regular rate and rhythm with occasional  extrasystoles.  No murmurs, rubs, or gallops. Abd: Bowel sounds present. Soft, NT/ND without hepatosplenomegaly Ext: No lower extremity edema. Radial, PT, and DP pulses are 2+ bilaterally. Skin: Warm and dry without rash.  EKG: Normal sinus rhythm with no significant abnormality.  Lab Results  Component Value Date   WBC 7.9 07/23/2016   HGB 14.0 07/23/2016   HCT 41.6 07/23/2016   MCV 88.0 07/23/2016   PLT 333.0 07/23/2016    Lab Results  Component Value Date   NA 140 08/25/2018   K 4.5 08/25/2018   CL 103 08/25/2018   CO2 27 08/25/2018   BUN 12 08/25/2018   CREATININE 0.75 08/25/2018   GLUCOSE 100 (H) 08/25/2018   ALT 23 08/25/2018    Lab Results  Component Value Date   CHOL 200 (H) 08/25/2018   HDL 52 08/25/2018   LDLCALC 118 (H) 08/25/2018   LDLDIRECT 153.4 05/07/2012   TRIG 188 (H) 08/25/2018   CHOLHDL 3.8 08/25/2018    --------------------------------------------------------------------------------------------------  ASSESSMENT AND PLAN: Palpitations: Occasional extrasystoles noted on exam but not seen on EKGs.  I suspect that isolated PACs or PVCs are responsible for the ectopy/palpitations.  We will check a BMP, magnesium level, and TSH today.  I have recommended increasing carvedilol to 6.25 mg twice daily.  We will have Martha Hayes follow-up in 1 month to reassess her symptoms.  If things have not improved, we will proceed with obtaining an echocardiogram and repeat ambulatory cardiac monitor.  Hypertension: Blood pressure borderline elevated on the systolic side today.  As above, we will increase carvedilol to 6.25 mg twice daily.  No other medication changes today.  Hyperlipidemia: Most recent LDL in 08/2018 mildly elevated at 118.  Continue simvastatin and further management/monitoring per Dr. Diona Browner.  Follow-up: Return to clinic in 1 month.  [name], MD 08/19/2019 8:06 AM

## 2019-08-20 LAB — BASIC METABOLIC PANEL
BUN/Creatinine Ratio: 19 (ref 12–28)
BUN: 13 mg/dL (ref 8–27)
CO2: 24 mmol/L (ref 20–29)
Calcium: 9.7 mg/dL (ref 8.7–10.3)
Chloride: 97 mmol/L (ref 96–106)
Creatinine, Ser: 0.67 mg/dL (ref 0.57–1.00)
GFR calc Af Amer: 104 mL/min/{1.73_m2} (ref >59)
GFR calc non Af Amer: 91 mL/min/{1.73_m2} (ref >59)
Glucose: 79 mg/dL (ref 65–99)
Potassium: 4.4 mmol/L (ref 3.5–5.2)
Sodium: 134 mmol/L (ref 134–144)

## 2019-08-20 LAB — MAGNESIUM: Magnesium: 2.2 mg/dL (ref 1.6–2.3)

## 2019-08-20 LAB — TSH: TSH: 2.77 u[IU]/mL (ref 0.450–4.500)

## 2019-08-27 ENCOUNTER — Telehealth: Payer: Self-pay | Admitting: Family Medicine

## 2019-08-27 DIAGNOSIS — E7800 Pure hypercholesterolemia, unspecified: Secondary | ICD-10-CM

## 2019-08-27 DIAGNOSIS — M8580 Other specified disorders of bone density and structure, unspecified site: Secondary | ICD-10-CM

## 2019-08-27 NOTE — Telephone Encounter (Signed)
-----   Message from Ellamae Sia sent at 08/25/2019 10:52 AM EST ----- Regarding: Lab orders for Monday, 11.9.20 Patient is scheduled for CPX labs, please order future labs, Thanks , Karna Christmas

## 2019-08-30 ENCOUNTER — Other Ambulatory Visit: Payer: Self-pay

## 2019-08-30 ENCOUNTER — Other Ambulatory Visit (INDEPENDENT_AMBULATORY_CARE_PROVIDER_SITE_OTHER): Payer: Medicare HMO

## 2019-08-30 DIAGNOSIS — E7800 Pure hypercholesterolemia, unspecified: Secondary | ICD-10-CM | POA: Diagnosis not present

## 2019-08-30 DIAGNOSIS — H02834 Dermatochalasis of left upper eyelid: Secondary | ICD-10-CM | POA: Diagnosis not present

## 2019-08-30 DIAGNOSIS — H02831 Dermatochalasis of right upper eyelid: Secondary | ICD-10-CM | POA: Diagnosis not present

## 2019-08-30 DIAGNOSIS — M8580 Other specified disorders of bone density and structure, unspecified site: Secondary | ICD-10-CM | POA: Diagnosis not present

## 2019-08-30 LAB — COMPREHENSIVE METABOLIC PANEL
ALT: 20 U/L (ref 0–35)
AST: 16 U/L (ref 0–37)
Albumin: 4.3 g/dL (ref 3.5–5.2)
Alkaline Phosphatase: 85 U/L (ref 39–117)
BUN: 14 mg/dL (ref 6–23)
CO2: 29 mEq/L (ref 19–32)
Calcium: 9.2 mg/dL (ref 8.4–10.5)
Chloride: 102 mEq/L (ref 96–112)
Creatinine, Ser: 0.72 mg/dL (ref 0.40–1.20)
GFR: 80.4 mL/min (ref >60.00)
Glucose, Bld: 103 mg/dL — ABNORMAL HIGH (ref 70–99)
Potassium: 4.1 mEq/L (ref 3.5–5.1)
Sodium: 137 mEq/L (ref 135–145)
Total Bilirubin: 0.5 mg/dL (ref 0.2–1.2)
Total Protein: 7.1 g/dL (ref 6.0–8.3)

## 2019-08-30 LAB — LIPID PANEL
Cholesterol: 167 mg/dL (ref 0–200)
HDL: 43 mg/dL (ref >39.00)
LDL Cholesterol: 88 mg/dL (ref 0–99)
NonHDL: 123.98
Total CHOL/HDL Ratio: 4
Triglycerides: 179 mg/dL — ABNORMAL HIGH (ref 0.0–149.0)
VLDL: 35.8 mg/dL (ref 0.0–40.0)

## 2019-08-30 LAB — VITAMIN D 25 HYDROXY (VIT D DEFICIENCY, FRACTURES): VITD: 38.97 ng/mL (ref 30.00–100.00)

## 2019-08-30 NOTE — Progress Notes (Signed)
No critical labs need to be addressed urgently. We will discuss labs in detail at upcoming office visit.   

## 2019-09-03 ENCOUNTER — Ambulatory Visit (INDEPENDENT_AMBULATORY_CARE_PROVIDER_SITE_OTHER): Payer: Medicare HMO | Admitting: Family Medicine

## 2019-09-03 ENCOUNTER — Other Ambulatory Visit: Payer: Self-pay

## 2019-09-03 VITALS — BP 128/68 | HR 60 | Temp 98.3°F | Ht 61.5 in | Wt 122.0 lb

## 2019-09-03 DIAGNOSIS — R7303 Prediabetes: Secondary | ICD-10-CM | POA: Insufficient documentation

## 2019-09-03 DIAGNOSIS — Z0000 Encounter for general adult medical examination without abnormal findings: Secondary | ICD-10-CM

## 2019-09-03 DIAGNOSIS — E7800 Pure hypercholesterolemia, unspecified: Secondary | ICD-10-CM | POA: Diagnosis not present

## 2019-09-03 DIAGNOSIS — I10 Essential (primary) hypertension: Secondary | ICD-10-CM

## 2019-09-03 DIAGNOSIS — E782 Mixed hyperlipidemia: Secondary | ICD-10-CM

## 2019-09-03 NOTE — Patient Instructions (Signed)
Preventive Care 68 Years and Older, Female Preventive care refers to lifestyle choices and visits with your health care provider that can promote health and wellness. This includes:  A yearly physical exam. This is also called an annual well check.  Regular dental and eye exams.  Immunizations.  Screening for certain conditions.  Healthy lifestyle choices, such as diet and exercise. What can I expect for my preventive care visit? Physical exam Your health care provider will check:  Height and weight. These may be used to calculate body mass index (BMI), which is a measurement that tells if you are at a healthy weight.  Heart rate and blood pressure.  Your skin for abnormal spots. Counseling Your health care provider may ask you questions about:  Alcohol, tobacco, and drug use.  Emotional well-being.  Home and relationship well-being.  Sexual activity.  Eating habits.  History of falls.  Memory and ability to understand (cognition).  Work and work Statistician.  Pregnancy and menstrual history. What immunizations do I need?  Influenza (flu) vaccine  This is recommended every year. Tetanus, diphtheria, and pertussis (Tdap) vaccine  You may need a Td booster every 10 years. Varicella (chickenpox) vaccine  You may need this vaccine if you have not already been vaccinated. Zoster (shingles) vaccine  You may need this after age 33. Pneumococcal conjugate (PCV13) vaccine  One dose is recommended after age 33. Pneumococcal polysaccharide (PPSV23) vaccine  One dose is recommended after age 72. Measles, mumps, and rubella (MMR) vaccine  You may need at least one dose of MMR if you were born in 1957 or later. You may also need a second dose. Meningococcal conjugate (MenACWY) vaccine  You may need this if you have certain conditions. Hepatitis A vaccine  You may need this if you have certain conditions or if you travel or work in places where you may be exposed  to hepatitis A. Hepatitis B vaccine  You may need this if you have certain conditions or if you travel or work in places where you may be exposed to hepatitis B. Haemophilus influenzae type b (Hib) vaccine  You may need this if you have certain conditions. You may receive vaccines as individual doses or as more than one vaccine together in one shot (combination vaccines). Talk with your health care provider about the risks and benefits of combination vaccines. What tests do I need? Blood tests  Lipid and cholesterol levels. These may be checked every 5 years, or more frequently depending on your overall health.  Hepatitis C test.  Hepatitis B test. Screening  Lung cancer screening. You may have this screening every year starting at age 39 if you have a 30-pack-year history of smoking and currently smoke or have quit within the past 15 years.  Colorectal cancer screening. All adults should have this screening starting at age 36 and continuing until age 15. Your health care provider may recommend screening at age 23 if you are at increased risk. You will have tests every 1-10 years, depending on your results and the type of screening test.  Diabetes screening. This is done by checking your blood sugar (glucose) after you have not eaten for a while (fasting). You may have this done every 1-3 years.  Mammogram. This may be done every 1-2 years. Talk with your health care provider about how often you should have regular mammograms.  BRCA-related cancer screening. This may be done if you have a family history of breast, ovarian, tubal, or peritoneal cancers.  Other tests  Sexually transmitted disease (STD) testing.  Bone density scan. This is done to screen for osteoporosis. You may have this done starting at age 76. Follow these instructions at home: Eating and drinking  Eat a diet that includes fresh fruits and vegetables, whole grains, lean protein, and low-fat dairy products. Limit  your intake of foods with high amounts of sugar, saturated fats, and salt.  Take vitamin and mineral supplements as recommended by your health care provider.  Do not drink alcohol if your health care provider tells you not to drink.  If you drink alcohol: ? Limit how much you have to 0-1 drink a day. ? Be aware of how much alcohol is in your drink. In the [place] one drink equals one 12 oz bottle of beer (355 mL), one 5 oz glass of wine (148 mL), or one 1 oz glass of hard liquor (44 mL). Lifestyle  Take daily care of your teeth and gums.  Stay active. Exercise for at least 30 minutes on 5 or more days each week.  Do not use any products that contain nicotine or tobacco, such as cigarettes, e-cigarettes, and chewing tobacco. If you need help quitting, ask your health care provider.  If you are sexually active, practice safe sex. Use a condom or other form of protection in order to prevent STIs (sexually transmitted infections).  Talk with your health care provider about taking a low-dose aspirin or statin. What's next?  Go to your health care provider once a year for a well check visit.  Ask your health care provider how often you should have your eyes and teeth checked.  Stay up to date on all vaccines. This information is not intended to replace advice given to you by your health care provider. Make sure you discuss any questions you have with your health care provider. Document Released: 11/03/2015 Document Revised: 10/01/2018 Document Reviewed: 10/01/2018 Elsevier Patient Education  2020 [employer].

## 2019-09-03 NOTE — Assessment & Plan Note (Signed)
Work on low [employer].

## 2019-09-03 NOTE — Assessment & Plan Note (Signed)
Good control on lisinopril htcz, coreg.

## 2019-09-03 NOTE — Assessment & Plan Note (Signed)
At goal on statin 

## 2019-09-03 NOTE — Progress Notes (Signed)
Chief Complaint  Patient presents with  . Medicare Wellness    History of Present Illness: HPI   The patient presents for annual medicare wellness, complete physical and review of chronic health problems. He/She also has the following acute concerns today: none  I have personally reviewed the Medicare Annual Wellness questionnaire and have noted 1. The patient's medical and social history 2. Their use of alcohol, tobacco or illicit drugs 3. Their current medications and supplements 4. The patient's functional ability including ADL's, fall risks, home safety risks and hearing or visual             impairment. 5. Diet and physical activities 6. Evidence for depression or mood disorders 7.         Updated provider list Cognitive evaluation was performed and recorded on pt medicare questionnaire form. The patients weight, height, BMI and visual acuity have been recorded in the chart  I have made referrals, counseling and provided education to the patient based review of the above and I have provided the pt with a written personalized care plan for preventive services.   Documentation of this information was scanned into the electronic record under the media tab.   Advance directives and end of life planning reviewed in detail with patient and documented in EMR. Patient given handout on advance care directives if needed. HCPOA and living will updated if needed.  Fall Risk  09/03/2019 09/10/2018 09/01/2018 08/05/2017 07/05/2016  Falls in the past year? 0 0 0 No No  Comment - Emmi Telephone Survey: data to providers prior to load - - -  Number falls in past yr: 0 - - - -     Hearing Screening   125Hz  250Hz  500Hz  1000Hz  2000Hz  3000Hz  4000Hz  6000Hz  8000Hz   Right ear:   20 20 20  20     Left ear:   20 20 20  20     Vision Screening Comments: [facility] 08/03/2019   PHQ9 SCORE ONLY 09/03/2019 09/01/2018 08/05/2017  Score 0 0 0   Hypertension:  Good control on lisinopril htcz,  coreg.  Using medication without problems or lightheadedness: none  Chest pain with exertion:none Edema:none Short of breath:none Average home BPs: Other issues: Followed by Dr. Saunders Revel for pal[pitations.. increase coreg.  Elevated Cholesterol:  At goal on statin. Lab Results  Component Value Date   CHOL 167 08/30/2019   HDL 43.00 08/30/2019   LDLCALC 88 08/30/2019   LDLDIRECT 153.4 05/07/2012   TRIG 179.0 (H) 08/30/2019   CHOLHDL 4 08/30/2019  Using medications without problems: Muscle aches:  Diet compliance: good Exercise: daily walking 4 miles Other complaints:   Body mass index is 22.68 kg/m.   COVID 19 screen No recent travel or known exposure to COVID19 The patient denies respiratory symptoms of COVID 19 at this time.  The importance of social distancing was discussed today.   Review of Systems  Constitutional: Negative for chills and fever.  HENT: Negative for congestion and ear pain.   Eyes: Negative for pain and redness.  Respiratory: Negative for cough and shortness of breath.   Cardiovascular: Negative for chest pain, palpitations and leg swelling.  Gastrointestinal: Negative for abdominal pain, blood in stool, constipation, diarrhea, nausea and vomiting.  Genitourinary: Negative for dysuria.  Musculoskeletal: Negative for falls and myalgias.  Skin: Negative for rash.  Neurological: Negative for dizziness.  Psychiatric/Behavioral: Negative for depression. The patient is not nervous/anxious.       Past Medical History:  Diagnosis Date  . Anemia,  unspecified    due to menorrhagia  . DDD (degenerative disc disease), lumbosacral    MRI lumbar 2003  . Hyperlipidemia   . Hypertension   . PSVT (paroxysmal supraventricular tachycardia) ([place])     reports that she has never smoked. She has never used smokeless tobacco. She reports current alcohol use of about 5.0 standard drinks of alcohol per week. She reports that she does not use drugs.   Current Outpatient  Medications:  .  carvedilol (COREG) 6.25 MG tablet, Take 0.5 tablets (3.125 mg total) by mouth 2 (two) times daily with a meal. (Patient taking differently: Take 6.25 mg by mouth 2 (two) times daily with a meal. ), Disp: 60 tablet, Rfl: 2 .  hydrochlorothiazide (MICROZIDE) 12.5 MG capsule, TAKE 1 CAPSULE BY MOUTH EVERY DAY, Disp: 90 capsule, Rfl: 0 .  lisinopril (ZESTRIL) 10 MG tablet, TAKE 1 TABLET BY MOUTH EVERY DAY, Disp: 90 tablet, Rfl: 0 .  simvastatin (ZOCOR) 20 MG tablet, TAKE 1 TABLET BY MOUTH EVERY DAY IN THE EVENING, Disp: 90 tablet, Rfl: 3   Observations/Objective: Blood pressure 128/68, pulse 60, temperature 98.3 F (36.8 C), temperature source Temporal, height 5' 1.5" (1.562 m), weight 122 lb (55.3 kg), SpO2 98 %.  Physical Exam Constitutional:      General: She is not in acute distress.    Appearance: Normal appearance. She is well-developed. She is not ill-appearing or toxic-appearing.  HENT:     Head: Normocephalic.     Right Ear: Hearing, tympanic membrane, ear canal and external ear normal.     Left Ear: Hearing, tympanic membrane, ear canal and external ear normal.     Nose: Nose normal.  Eyes:     General: Lids are normal. Lids are everted, no foreign bodies appreciated.     Conjunctiva/sclera: Conjunctivae normal.     Pupils: Pupils are equal, round, and reactive to light.  Neck:     Musculoskeletal: Normal range of motion and neck supple.     Thyroid: No thyroid mass or thyromegaly.     Vascular: No carotid bruit.     Trachea: Trachea normal.  Cardiovascular:     Rate and Rhythm: Normal rate and regular rhythm.     Heart sounds: Normal heart sounds, S1 normal and S2 normal. No murmur. No gallop.   Pulmonary:     Effort: Pulmonary effort is normal. No respiratory distress.     Breath sounds: Normal breath sounds. No wheezing, rhonchi or rales.  Chest:     Breasts: Breasts are symmetrical.        Right: Normal.        Left: Normal.  Abdominal:     General:  Bowel sounds are normal. There is no distension or abdominal bruit.     Palpations: Abdomen is soft. There is no fluid wave or mass.     Tenderness: There is no abdominal tenderness. There is no guarding or rebound.     Hernia: No hernia is present.  Lymphadenopathy:     Cervical: No cervical adenopathy.  Skin:    General: Skin is warm and dry.     Findings: No rash.  Neurological:     Mental Status: She is alert.     Cranial Nerves: No cranial nerve deficit.     Sensory: No sensory deficit.  Psychiatric:        Mood and Affect: Mood is not anxious or depressed.        Speech: Speech normal.  Behavior: Behavior normal. Behavior is cooperative.        Judgment: Judgment normal.      Assessment and Plan   The patient's preventative maintenance and recommended screening tests for an annual wellness exam were reviewed in full today. Brought up to date unless services declined.  Counselled on the importance of diet, exercise, and its role in overall health and mortality. The patient's FH and SH was reviewed, including their home life, tobacco status, and drug and alcohol status.   Vaccines: Uptodate zoster, flu,PCV.Consider Td. PAP/DVE: partial hysterectomy,No family history of ovarian cancer,DVEnot indicated. Mammo: stable7/2020, pt wishes to do MBE yearly. Colonoscopy: Dr. Watt Climes Date:  02/2018 Results: Diverticulosis , hemorrhoids, repeat in 10 years DEXA: 2010.. Osteopenia.Marland Kitchen Has not been taking ca and vit D. Does not EVER want to do DEXA, would not use meds to treat.  Vit D in nml range. HEP C screen:done HIV screen:refused nonsmoker  HYPERTENSION, MILD Good control on lisinopril htcz, coreg.  Mixed hyperlipidemia  At goal on statin.  Prediabetes  Work on low carb diet.    Eliezer Lofts, MD

## 2019-09-22 ENCOUNTER — Ambulatory Visit: Payer: Medicare HMO | Admitting: Internal Medicine

## 2019-09-29 ENCOUNTER — Other Ambulatory Visit: Payer: Self-pay | Admitting: Family Medicine

## 2019-10-23 ENCOUNTER — Other Ambulatory Visit: Payer: Self-pay | Admitting: Family Medicine

## 2019-10-26 NOTE — Progress Notes (Signed)
Follow-up Outpatient Visit Date: 10/27/2019  Primary Care Provider: Jinny Sanders, MD [place] [address]  Chief Complaint: Follow-up palpitations  HPI:  [name] is a 69 y.o. female with history of PSVT, hypertension, and hyperlipidemia, who presents for follow-up of palpitations.  I last saw her in late October, 2020, at which time she reported intermittent palpitations over the preceding 2-3 months.  She had previously responded well to carvedilol but like it was no longer as effective.  We agreed to increase carvedilol to 6.25 mg BID.  Today, [name] reports that she is feeling very well.  Her palpitations have decreased significantly since escalating carvedilol.  She denies other symptoms including chest pain, shortness of breath, lightheadedness, orthopnea, and edema.  She is walking 4 miles a day without limitations.  Home blood pressures are typically similar to today's are slightly higher, up to 140/80.  --------------------------------------------------------------------------------------------------  Cardiovascular History & Procedures: Cardiovascular Problems:  Palpitations with paroxysmal supraventricular tachycardia  Risk Factors:  None  Cath/PCI:  None  CV Surgery:  None  EP Procedures and Devices:  14-day event monitor (07/31/16): Predominant rhythm was sinus with average rate 75 bpm (range 50-153 bpm). Rare isolated ventricular and supraventricular ectopy was noted. There were 2 episodes of SVT lasting 10 beats with a maximum rate of 164 bpm. Morphology is suggestive of atrial tachycardia. Patient reported symptoms correspond to sinus rhythm with isolated PVCs.  Recent CV Pertinent Labs: Lab Results  Component Value Date   CHOL 167 08/30/2019   HDL 43.00 08/30/2019   LDLCALC 88 08/30/2019   LDLCALC 118 (H) 08/25/2018   LDLDIRECT 153.4 05/07/2012   TRIG 179.0 (H) 08/30/2019   CHOLHDL 4 08/30/2019   K 4.1 08/30/2019   MG  2.2 08/19/2019   BUN 14 08/30/2019   BUN 13 08/19/2019   CREATININE 0.72 08/30/2019   CREATININE 0.75 08/25/2018    Past medical and surgical history were reviewed and updated in EPIC.  Current Meds  Medication Sig  . carvedilol (COREG) 6.25 MG tablet Take 1 tablet (6.25 mg total) by mouth 2 (two) times daily with a meal.  . hydrochlorothiazide (MICROZIDE) 12.5 MG capsule TAKE 1 CAPSULE BY MOUTH EVERY DAY  . lisinopril (ZESTRIL) 10 MG tablet TAKE 1 TABLET BY MOUTH EVERY DAY  . simvastatin (ZOCOR) 20 MG tablet TAKE 1 TABLET BY MOUTH EVERY DAY IN THE EVENING  . [DISCONTINUED] carvedilol (COREG) 6.25 MG tablet Take 6.25 mg by mouth 2 (two) times daily with a meal.    Allergies: Penicillins  Social History   Tobacco Use  . Smoking status: Never Smoker  . Smokeless tobacco: Never Used  Substance Use Topics  . Alcohol use: Yes    Alcohol/week: 5.0 standard drinks    Types: 5 Glasses of wine per week  . Drug use: No    Family History  Problem Relation Age of Onset  . Diabetes Father   . Hypertension Father   . Seizures Father        petit mal seizure disorder  . Heart disease Father        defibrillation  . Heart disease Mother        CAD, two stents  . Diabetes Mother        developed in 59's  . Osteoporosis Mother   . Hypertension Mother   . Heart disease Sister        pacemaker  . Sudden Cardiac Death Sister 61  aborted; had h/o bundle branch block    Review of Systems: A 12-system review of systems was performed and was negative except as noted in the HPI.  --------------------------------------------------------------------------------------------------  Physical Exam: BP 120/60 (BP Location: Left Arm, Patient Position: Sitting, Cuff Size: Normal)   Pulse 68   Ht 5\' 2"  (1.575 m)   Wt 123 lb 8 oz (56 kg)   BMI 22.59 kg/m   General: NAD. HEENT: No conjunctival pallor or scleral icterus. Facemask in place. Neck: Supple without lymphadenopathy,  thyromegaly, JVD, or HJR. Lungs: Normal work of breathing. Clear to auscultation bilaterally without wheezes or crackles. Heart: Regular rate and rhythm without murmurs, rubs, or gallops. Non-displaced PMI. Abd: Bowel sounds present. Soft, NT/ND without hepatosplenomegaly Ext: No lower extremity edema. Radial, PT, and DP pulses are 2+ bilaterally. Skin: Warm and dry without rash.  EKG: Normal sinus rhythm.  Lab Results  Component Value Date   WBC 7.9 07/23/2016   HGB 14.0 07/23/2016   HCT 41.6 07/23/2016   MCV 88.0 07/23/2016   PLT 333.0 07/23/2016    Lab Results  Component Value Date   NA 137 08/30/2019   K 4.1 08/30/2019   CL 102 08/30/2019   CO2 29 08/30/2019   BUN 14 08/30/2019   CREATININE 0.72 08/30/2019   GLUCOSE 103 (H) 08/30/2019   ALT 20 08/30/2019    Lab Results  Component Value Date   CHOL 167 08/30/2019   HDL 43.00 08/30/2019   LDLCALC 88 08/30/2019   LDLDIRECT 153.4 05/07/2012   TRIG 179.0 (H) 08/30/2019   CHOLHDL 4 08/30/2019    --------------------------------------------------------------------------------------------------  ASSESSMENT AND PLAN: PSVT: Symptoms significantly improved with escalation of carvedilol.  EKG today is normal.  There are no other signs or symptoms to suggest significant structural heart disease.  We will therefore defer additional testing and continue with carvedilol 6.25 mg twice daily.  Hypertension: Blood pressure well controlled today.  Continue current regimen of carvedilol, HCTZ, and lisinopril.  Hyperlipidemia: Continue simvastatin and further management per Dr. Diona Browner.  Follow-up: Return to clinic in 1 year.  [name], MD 10/27/2019 8:44 AM

## 2019-10-27 ENCOUNTER — Encounter: Payer: Self-pay | Admitting: Internal Medicine

## 2019-10-27 ENCOUNTER — Ambulatory Visit (INDEPENDENT_AMBULATORY_CARE_PROVIDER_SITE_OTHER): Payer: Medicare HMO | Admitting: Internal Medicine

## 2019-10-27 ENCOUNTER — Other Ambulatory Visit: Payer: Self-pay

## 2019-10-27 VITALS — BP 120/60 | HR 68 | Ht 62.0 in | Wt 123.5 lb

## 2019-10-27 DIAGNOSIS — I471 Supraventricular tachycardia: Secondary | ICD-10-CM | POA: Diagnosis not present

## 2019-10-27 DIAGNOSIS — I10 Essential (primary) hypertension: Secondary | ICD-10-CM

## 2019-10-27 DIAGNOSIS — E782 Mixed hyperlipidemia: Secondary | ICD-10-CM

## 2019-10-27 MED ORDER — CARVEDILOL 6.25 MG PO TABS: 6.2500 mg | ORAL_TABLET | Freq: Two times a day (BID) | ORAL | 3 refills | Status: DC

## 2019-10-27 NOTE — Patient Instructions (Signed)
Medication Instructions:  Your physician recommends that you continue on your current medications as directed. Please refer to the Current Medication list given to you today.  *If you need a refill on your cardiac medications before your next appointment, please call your pharmacy*  Lab Work: none If you have labs (blood work) drawn today and your tests are completely normal, you will receive your results only by: Marland Kitchen MyChart Message (if you have MyChart) OR . A paper copy in the mail If you have any lab test that is abnormal or we need to change your treatment, we will call you to review the results.  Testing/Procedures: none  Follow-Up: At [facility] you and your health needs are our priority.  As part of our continuing mission to provide you with exceptional heart care, we have created designated Provider Care Teams.  These Care Teams include your primary Cardiologist (physician) and Advanced Practice Providers (APPs -  Physician Assistants and Nurse Practitioners) who all work together to provide you with the care you need, when you need it.  Your next appointment:   12 month(s)  The format for your next appointment:   In Person  Provider:    You may see DR Harrell Gave END or one of the following Advanced Practice Providers on your designated Care Team:    Murray Hodgkins, NP  Christell Faith, PA-C  Marrianne Mood, PA-C

## 2019-11-12 ENCOUNTER — Ambulatory Visit: Payer: Medicare HMO | Attending: Family Medicine

## 2019-11-12 DIAGNOSIS — R69 Illness, unspecified: Secondary | ICD-10-CM | POA: Diagnosis not present

## 2019-11-12 DIAGNOSIS — Z23 Encounter for immunization: Secondary | ICD-10-CM | POA: Insufficient documentation

## 2019-11-12 NOTE — Progress Notes (Signed)
   Covid-19 Vaccination Clinic  Name:  Martha Hayes    MRN: [id number] DOB: 01-Jul-1951  11/12/2019  [name] was observed post Covid-19 immunization for 15 minutes without incidence. She was provided with Vaccine Information Sheet and instruction to access the V-Safe system.   [name] was instructed to call 911 with any severe reactions post vaccine: Marland Kitchen Difficulty breathing  . Swelling of your face and throat  . A fast heartbeat  . A bad rash all over your body  . Dizziness and weakness    Immunizations Administered    Name Date Dose VIS Date Route   Pfizer COVID-19 Vaccine 11/12/2019 11:36 AM 0.3 mL 10/01/2019 Intramuscular   Manufacturer: [place]   Lot: [id number]   [place]: [id number]

## 2019-12-03 ENCOUNTER — Ambulatory Visit: Payer: Medicare HMO | Attending: Internal Medicine

## 2019-12-03 DIAGNOSIS — Z23 Encounter for immunization: Secondary | ICD-10-CM | POA: Insufficient documentation

## 2019-12-03 NOTE — Progress Notes (Signed)
   Covid-19 Vaccination Clinic  Name:  Martha Hayes    MRN: [id number] DOB: Mar 19, 1951  12/03/2019  [name] was observed post Covid-19 immunization for 15 minutes without incidence. She was provided with Vaccine Information Sheet and instruction to access the V-Safe system.   [name] was instructed to call 911 with any severe reactions post vaccine: Marland Kitchen Difficulty breathing  . Swelling of your face and throat  . A fast heartbeat  . A bad rash all over your body  . Dizziness and weakness    Immunizations Administered    Name Date Dose VIS Date Route   Pfizer COVID-19 Vaccine 12/03/2019  3:57 PM 0.3 mL 10/01/2019 Intramuscular   Manufacturer: [place]   Lot: [id number]   [place]: [id number]

## 2020-03-29 ENCOUNTER — Other Ambulatory Visit: Payer: Self-pay | Admitting: Family Medicine

## 2020-03-29 DIAGNOSIS — Z1231 Encounter for screening mammogram for malignant neoplasm of breast: Secondary | ICD-10-CM

## 2020-04-26 ENCOUNTER — Ambulatory Visit
Admission: RE | Admit: 2020-04-26 | Discharge: 2020-04-26 | Disposition: A | Discharge status: Home / Self Care | Payer: Medicare HMO | Source: Ambulatory Visit | Attending: Family Medicine | Admitting: Family Medicine

## 2020-04-26 ENCOUNTER — Other Ambulatory Visit: Payer: Self-pay

## 2020-04-26 DIAGNOSIS — Z1231 Encounter for screening mammogram for malignant neoplasm of breast: Secondary | ICD-10-CM

## 2020-08-16 ENCOUNTER — Ambulatory Visit (INDEPENDENT_AMBULATORY_CARE_PROVIDER_SITE_OTHER): Payer: Medicare HMO

## 2020-08-16 ENCOUNTER — Other Ambulatory Visit: Payer: Self-pay

## 2020-08-16 DIAGNOSIS — Z23 Encounter for immunization: Secondary | ICD-10-CM | POA: Diagnosis not present

## 2020-09-04 ENCOUNTER — Ambulatory Visit: Payer: Medicare HMO

## 2020-09-04 ENCOUNTER — Other Ambulatory Visit: Payer: Medicare HMO

## 2020-09-05 ENCOUNTER — Other Ambulatory Visit: Payer: Self-pay

## 2020-09-05 ENCOUNTER — Telehealth: Payer: Self-pay | Admitting: Family Medicine

## 2020-09-05 ENCOUNTER — Other Ambulatory Visit (INDEPENDENT_AMBULATORY_CARE_PROVIDER_SITE_OTHER): Payer: Medicare HMO

## 2020-09-05 DIAGNOSIS — R7303 Prediabetes: Secondary | ICD-10-CM | POA: Diagnosis not present

## 2020-09-05 DIAGNOSIS — E7800 Pure hypercholesterolemia, unspecified: Secondary | ICD-10-CM

## 2020-09-05 LAB — LIPID PANEL
Cholesterol: 167 mg/dL (ref 0–200)
HDL: 50.1 mg/dL (ref >39.00)
LDL Cholesterol: 88 mg/dL (ref 0–99)
NonHDL: 117.3
Total CHOL/HDL Ratio: 3
Triglycerides: 146 mg/dL (ref 0.0–149.0)
VLDL: 29.2 mg/dL (ref 0.0–40.0)

## 2020-09-05 LAB — COMPREHENSIVE METABOLIC PANEL
ALT: 25 U/L (ref 0–35)
AST: 21 U/L (ref 0–37)
Albumin: 4.3 g/dL (ref 3.5–5.2)
Alkaline Phosphatase: 85 U/L (ref 39–117)
BUN: 16 mg/dL (ref 6–23)
CO2: 29 mEq/L (ref 19–32)
Calcium: 9.3 mg/dL (ref 8.4–10.5)
Chloride: 101 mEq/L (ref 96–112)
Creatinine, Ser: 0.8 mg/dL (ref 0.40–1.20)
GFR: 75.04 mL/min (ref >60.00)
Glucose, Bld: 93 mg/dL (ref 70–99)
Potassium: 4.3 mEq/L (ref 3.5–5.1)
Sodium: 136 mEq/L (ref 135–145)
Total Bilirubin: 0.5 mg/dL (ref 0.2–1.2)
Total Protein: 7 g/dL (ref 6.0–8.3)

## 2020-09-05 LAB — HEMOGLOBIN A1C: Hgb A1c MFr Bld: 6.1 % (ref 4.6–6.5)

## 2020-09-05 NOTE — Progress Notes (Signed)
No critical labs need to be addressed urgently. We will discuss labs in detail at upcoming office visit.   

## 2020-09-05 NOTE — Telephone Encounter (Signed)
-----   Message from Ellamae Sia sent at 08/24/2020  3:22 PM EDT ----- Regarding: lab orders for Tuesday, 11.16.21 Patient is scheduled for CPX labs, please order future labs, Thanks , Karna Christmas

## 2020-09-07 ENCOUNTER — Ambulatory Visit: Payer: Medicare HMO | Attending: Internal Medicine

## 2020-09-07 DIAGNOSIS — Z23 Encounter for immunization: Secondary | ICD-10-CM

## 2020-09-07 NOTE — Progress Notes (Signed)
   Covid-19 Vaccination Clinic  Name:  Martha Hayes    MRN: [id number] DOB: 06-29-1951  09/07/2020  [name] was observed post Covid-19 immunization for 15 minutes without incident. She was provided with Vaccine Information Sheet and instruction to access the V-Safe system.   [name] was instructed to call 911 with any severe reactions post vaccine: Marland Kitchen Difficulty breathing  . Swelling of face and throat  . A fast heartbeat  . A bad rash all over body  . Dizziness and weakness   Immunizations Administered    Name Date Dose VIS Date Route   Pfizer COVID-19 Vaccine 09/07/2020  1:05 PM 0.3 mL 08/09/2020 Intramuscular   Manufacturer: [place]   Lot: [id number]   [place]: [address]-8

## 2020-09-08 ENCOUNTER — Other Ambulatory Visit: Payer: Self-pay

## 2020-09-08 ENCOUNTER — Encounter: Payer: Self-pay | Admitting: Family Medicine

## 2020-09-08 ENCOUNTER — Ambulatory Visit (INDEPENDENT_AMBULATORY_CARE_PROVIDER_SITE_OTHER): Payer: Medicare HMO | Admitting: Family Medicine

## 2020-09-08 VITALS — BP 102/60 | HR 52 | Temp 97.3°F | Ht 61.5 in | Wt 125.5 lb

## 2020-09-08 DIAGNOSIS — I10 Essential (primary) hypertension: Secondary | ICD-10-CM | POA: Diagnosis not present

## 2020-09-08 DIAGNOSIS — E7800 Pure hypercholesterolemia, unspecified: Secondary | ICD-10-CM | POA: Diagnosis not present

## 2020-09-08 DIAGNOSIS — Z0000 Encounter for general adult medical examination without abnormal findings: Secondary | ICD-10-CM | POA: Diagnosis not present

## 2020-09-08 NOTE — Progress Notes (Signed)
Chief Complaint  Patient presents with  . Medicare Wellness    History of Present Illness: HPI The patient presents for annual medicare wellness, complete physical and review of chronic health problems. He/She also has the following acute concerns today: none   I have personally reviewed the Medicare Annual Wellness questionnaire and have noted 1. The patient's medical and social history 2. Their use of alcohol, tobacco or illicit drugs 3. Their current medications and supplements 4. The patient's functional ability including ADL's, fall risks, home safety risks and hearing or visual             impairment. 5. Diet and physical activities 6. Evidence for depression or mood disorders 7.         Updated provider list Cognitive evaluation was performed and recorded on pt medicare questionnaire form. The patients weight, height, BMI and visual acuity have been recorded in the chart  I have made referrals, counseling and provided education to the patient based review of the above and I have provided the pt with a written personalized care plan for preventive services.   Documentation of this information was scanned into the electronic record under the media tab.  Advance directives and end of life planning reviewed in detail with patient and documented in EMR. Patient given handout on advance care directives if needed. HCPOA and living will updated if needed.   Hearing Screening   Method: Audiometry   125Hz  250Hz  500Hz  1000Hz  2000Hz  3000Hz  4000Hz  6000Hz  8000Hz   Right ear:   20 20 20  20     Left ear:   20 20 20  20       Visual Acuity Screening   Right eye Left eye Both eyes  Without correction: 20/30 20/25 20/25   With correction:       Accidental.. slipped in kitchen. Fall Risk  09/08/2020 09/03/2019 09/10/2018 09/01/2018 08/05/2017  Falls in the past year? 1 0 0 0 No  Comment - - Emmi Telephone Survey: data to providers prior to load - -  Number falls in past yr: 1 0 - - -   Injury with Fall? 0 - - - -      Office Visit from 09/08/2020 in [place] at [facility]  PHQ-2 Total Score 0     Hypertension:    BP Readings from Last 3 Encounters:  09/08/20 102/60  10/27/19 120/60  09/03/19 128/68  Using medication without problems or lightheadedness:  none Chest pain with exertion: none Edema:none Short of breath: none Average home BPs: Other issues:  PSVT: Stable on coreg, followed by Dr. Saunders Revel. Reviewed last OV note 10/2019  Elevated Cholesterol: On statin.  At goal. Lab Results  Component Value Date   CHOL 167 09/05/2020   HDL 50.10 09/05/2020   LDLCALC 88 09/05/2020   LDLDIRECT 153.4 05/07/2012   TRIG 146.0 09/05/2020   CHOLHDL 3 09/05/2020  Using medications without problems: Muscle aches:  Diet compliance: healthy Exercise:walking 4 miles daily Other complaints:  prediabetes  Lab Results  Component Value Date   HGBA1C 6.1 09/05/2020      This visit occurred during the SARS-CoV-2 public health emergency.  Safety protocols were in place, including screening questions prior to the visit, additional usage of staff PPE, and extensive cleaning of exam room while observing appropriate contact time as indicated for disinfecting solutions.   COVID 19 screen:  No recent travel or known exposure to COVID19 The patient denies respiratory symptoms of COVID 19 at this time. The importance of  social distancing was discussed today.     Review of Systems  Constitutional: Negative for chills and fever.  HENT: Negative for congestion and ear pain.   Eyes: Negative for pain and redness.  Respiratory: Negative for cough and shortness of breath.   Cardiovascular: Negative for chest pain, palpitations and leg swelling.  Gastrointestinal: Negative for abdominal pain, blood in stool, constipation, diarrhea, nausea and vomiting.  Genitourinary: Negative for dysuria.  Musculoskeletal: Negative for falls and myalgias.  Skin: Negative for rash.   Neurological: Negative for dizziness.  Psychiatric/Behavioral: Negative for depression. The patient is not nervous/anxious.       Past Medical History:  Diagnosis Date  . Anemia, unspecified    due to menorrhagia  . DDD (degenerative disc disease), lumbosacral    MRI lumbar 2003  . Hyperlipidemia   . Hypertension   . PSVT (paroxysmal supraventricular tachycardia) ([place])     reports that she has never smoked. She has never used smokeless tobacco. She reports current alcohol use of about 5.0 standard drinks of alcohol per week. She reports that she does not use drugs.   Current Outpatient Medications:  .  carvedilol (COREG) 6.25 MG tablet, Take 1 tablet (6.25 mg total) by mouth 2 (two) times daily with a meal., Disp: 180 tablet, Rfl: 3 .  hydrochlorothiazide (MICROZIDE) 12.5 MG capsule, TAKE 1 CAPSULE BY MOUTH EVERY DAY, Disp: 90 capsule, Rfl: 3 .  lisinopril (ZESTRIL) 10 MG tablet, TAKE 1 TABLET BY MOUTH EVERY DAY, Disp: 90 tablet, Rfl: 3 .  simvastatin (ZOCOR) 20 MG tablet, TAKE 1 TABLET BY MOUTH EVERY DAY IN THE EVENING, Disp: 90 tablet, Rfl: 3   Observations/Objective: Blood pressure 102/60, pulse (!) 52, temperature (!) 97.3 F (36.3 C), temperature source Temporal, height 5' 1.5" (1.562 m), weight 125 lb 8 oz (56.9 kg), SpO2 98 %.  Physical Exam Constitutional:      General: She is not in acute distress.    Appearance: Normal appearance. She is well-developed. She is not ill-appearing or toxic-appearing.  HENT:     Head: Normocephalic.     Right Ear: Hearing, tympanic membrane, ear canal and external ear normal. Tympanic membrane is not erythematous, retracted or bulging.     Left Ear: Hearing, tympanic membrane, ear canal and external ear normal. Tympanic membrane is not erythematous, retracted or bulging.     Nose: No mucosal edema or rhinorrhea.     Right Sinus: No maxillary sinus tenderness or frontal sinus tenderness.     Left Sinus: No maxillary sinus tenderness or  frontal sinus tenderness.     Mouth/Throat:     Pharynx: Uvula midline.  Eyes:     General: Lids are normal. Lids are everted, no foreign bodies appreciated.     Conjunctiva/sclera: Conjunctivae normal.     Pupils: Pupils are equal, round, and reactive to light.  Neck:     Thyroid: No thyroid mass or thyromegaly.     Vascular: No carotid bruit.     Trachea: Trachea normal.  Cardiovascular:     Rate and Rhythm: Normal rate and regular rhythm.     Pulses: Normal pulses.     Heart sounds: Normal heart sounds, S1 normal and S2 normal. No murmur heard.  No friction rub. No gallop.   Pulmonary:     Effort: Pulmonary effort is normal. No tachypnea or respiratory distress.     Breath sounds: Normal breath sounds. No decreased breath sounds, wheezing, rhonchi or rales.  Chest:  Breasts: Breasts are symmetrical.        Right: Normal.        Left: Normal.  Abdominal:     General: Bowel sounds are normal.     Palpations: Abdomen is soft.     Tenderness: There is no abdominal tenderness.  Musculoskeletal:     Cervical back: Normal range of motion and neck supple.  Skin:    General: Skin is warm and dry.     Findings: No rash.  Neurological:     Mental Status: She is alert.  Psychiatric:        Mood and Affect: Mood is not anxious or depressed.        Speech: Speech normal.        Behavior: Behavior normal. Behavior is cooperative.        Thought Content: Thought content normal.        Judgment: Judgment normal.      Assessment and Plan   The patient's preventative maintenance and recommended screening tests for an annual wellness exam were reviewed in full today. Brought up to date unless services declined.  Counselled on the importance of diet, exercise, and its role in overall health and mortality. The patient's FH and SH was reviewed, including their home life, tobacco status, and drug and alcohol status.   Vaccines: Uptodate zoster, flu,PCV, COVID x 3Consider Td and  shingrix. PAP/DVE: partial hysterectomy,No family history of ovarian cancer,DVEnot indicated. Mammo: [id number], pt wishes to do MBE yearly. Colonoscopy: Dr. Watt Climes Date: 02/2018 Results: Diverticulosis , hemorrhoids, repeat in 10 years DEXA: 2010.. Osteopenia.Marland Kitchen Has not been taking ca and vit D. Does not EVER want to do DEXA, would not use meds to treat.  Vit D in nml range. HEP C screen:done HIV screen:refused nonsmoker  Pure hypercholesterolemia Well controlled. Continue current medication.   Essential hypertension Well controlled. Continue current medication.  Lisinopril, HCTZ and coreg.   Eliezer Lofts, MD

## 2020-09-08 NOTE — Patient Instructions (Addendum)
Start vit D 400 IU twice daily for bone strength.  Get calcium in diet.  Keep up great work on exercise.  Work on low [employer].  Consider shingrix.   Preventive Care 69 Years and Older, Female Preventive care refers to lifestyle choices and visits with your health care provider that can promote health and wellness. This includes:  A yearly physical exam. This is also called an annual well check.  Regular dental and eye exams.  Immunizations.  Screening for certain conditions.  Healthy lifestyle choices, such as diet and exercise. What can I expect for my preventive care visit? Physical exam Your health care provider will check:  Height and weight. These may be used to calculate body mass index (BMI), which is a measurement that tells if you are at a healthy weight.  Heart rate and blood pressure.  Your skin for abnormal spots. Counseling Your health care provider may ask you questions about:  Alcohol, tobacco, and drug use.  Emotional well-being.  Home and relationship well-being.  Sexual activity.  Eating habits.  History of falls.  Memory and ability to understand (cognition).  Work and work Statistician.  Pregnancy and menstrual history. What immunizations do I need?  Influenza (flu) vaccine  This is recommended every year. Tetanus, diphtheria, and pertussis (Tdap) vaccine  You may need a Td booster every 10 years. Varicella (chickenpox) vaccine  You may need this vaccine if you have not already been vaccinated. Zoster (shingles) vaccine  You may need this after age 69. Pneumococcal conjugate (PCV13) vaccine  One dose is recommended after age 69. Pneumococcal polysaccharide (PPSV23) vaccine  One dose is recommended after age 69. Measles, mumps, and rubella (MMR) vaccine  You may need at least one dose of MMR if you were born in 1957 or later. You may also need a second dose. Meningococcal conjugate (MenACWY) vaccine  You may need this if  you have certain conditions. Hepatitis A vaccine  You may need this if you have certain conditions or if you travel or work in places where you may be exposed to hepatitis A. Hepatitis B vaccine  You may need this if you have certain conditions or if you travel or work in places where you may be exposed to hepatitis B. Haemophilus influenzae type b (Hib) vaccine  You may need this if you have certain conditions. You may receive vaccines as individual doses or as more than one vaccine together in one shot (combination vaccines). Talk with your health care provider about the risks and benefits of combination vaccines. What tests do I need? Blood tests  Lipid and cholesterol levels. These may be checked every 5 years, or more frequently depending on your overall health.  Hepatitis C test.  Hepatitis B test. Screening  Lung cancer screening. You may have this screening every year starting at age 69 if you have a 30-pack-year history of smoking and currently smoke or have quit within the past 15 years.  Colorectal cancer screening. All adults should have this screening starting at age 69 and continuing until age 38. Your health care provider may recommend screening at age 69 if you are at increased risk. You will have tests every 1-10 years, depending on your results and the type of screening test.  Diabetes screening. This is done by checking your blood sugar (glucose) after you have not eaten for a while (fasting). You may have this done every 1-3 years.  Mammogram. This may be done every 1-2 years. Talk with  your health care provider about how often you should have regular mammograms.  BRCA-related cancer screening. This may be done if you have a family history of breast, ovarian, tubal, or peritoneal cancers. Other tests  Sexually transmitted disease (STD) testing.  Bone density scan. This is done to screen for osteoporosis. You may have this done starting at age 69. Follow these  instructions at home: Eating and drinking  Eat a diet that includes fresh fruits and vegetables, whole grains, lean protein, and low-fat dairy products. Limit your intake of foods with high amounts of sugar, saturated fats, and salt.  Take vitamin and mineral supplements as recommended by your health care provider.  Do not drink alcohol if your health care provider tells you not to drink.  If you drink alcohol: ? Limit how much you have to 0-1 drink a day. ? Be aware of how much alcohol is in your drink. In the [place] one drink equals one 12 oz bottle of beer (355 mL), one 5 oz glass of wine (148 mL), or one 1 oz glass of hard liquor (44 mL). Lifestyle  Take daily care of your teeth and gums.  Stay active. Exercise for at least 30 minutes on 5 or more days each week.  Do not use any products that contain nicotine or tobacco, such as cigarettes, e-cigarettes, and chewing tobacco. If you need help quitting, ask your health care provider.  If you are sexually active, practice safe sex. Use a condom or other form of protection in order to prevent STIs (sexually transmitted infections).  Talk with your health care provider about taking a low-dose aspirin or statin. What's next?  Go to your health care provider once a year for a well check visit.  Ask your health care provider how often you should have your eyes and teeth checked.  Stay up to date on all vaccines. This information is not intended to replace advice given to you by your health care provider. Make sure you discuss any questions you have with your health care provider. Document Revised: 10/01/2018 Document Reviewed: 10/01/2018 Elsevier Patient Education  2020 [employer].

## 2020-09-08 NOTE — Assessment & Plan Note (Signed)
Well controlled. Continue current medication.  Lisinopril, HCTZ and coreg.

## 2020-09-08 NOTE — Assessment & Plan Note (Signed)
Well controlled. Continue current medication.  

## 2020-09-13 ENCOUNTER — Other Ambulatory Visit: Payer: Self-pay | Admitting: Family Medicine

## 2020-09-13 ENCOUNTER — Ambulatory Visit: Payer: Medicare HMO | Admitting: Dermatology

## 2020-09-13 ENCOUNTER — Other Ambulatory Visit: Payer: Self-pay

## 2020-09-13 DIAGNOSIS — D229 Melanocytic nevi, unspecified: Secondary | ICD-10-CM

## 2020-09-13 DIAGNOSIS — Z1283 Encounter for screening for malignant neoplasm of skin: Secondary | ICD-10-CM

## 2020-09-13 DIAGNOSIS — L578 Other skin changes due to chronic exposure to nonionizing radiation: Secondary | ICD-10-CM

## 2020-09-13 DIAGNOSIS — D492 Neoplasm of unspecified behavior of bone, soft tissue, and skin: Secondary | ICD-10-CM

## 2020-09-13 DIAGNOSIS — C4492 Squamous cell carcinoma of skin, unspecified: Secondary | ICD-10-CM

## 2020-09-13 DIAGNOSIS — L570 Actinic keratosis: Secondary | ICD-10-CM | POA: Diagnosis not present

## 2020-09-13 DIAGNOSIS — D485 Neoplasm of uncertain behavior of skin: Secondary | ICD-10-CM | POA: Diagnosis not present

## 2020-09-13 DIAGNOSIS — B351 Tinea unguium: Secondary | ICD-10-CM | POA: Diagnosis not present

## 2020-09-13 DIAGNOSIS — L821 Other seborrheic keratosis: Secondary | ICD-10-CM

## 2020-09-13 DIAGNOSIS — L814 Other melanin hyperpigmentation: Secondary | ICD-10-CM | POA: Diagnosis not present

## 2020-09-13 DIAGNOSIS — Z86007 Personal history of in-situ neoplasm of skin: Secondary | ICD-10-CM | POA: Diagnosis not present

## 2020-09-13 DIAGNOSIS — C44329 Squamous cell carcinoma of skin of other parts of face: Secondary | ICD-10-CM | POA: Diagnosis not present

## 2020-09-13 DIAGNOSIS — D1800 Hemangioma unspecified site: Secondary | ICD-10-CM

## 2020-09-13 DIAGNOSIS — K130 Diseases of lips: Secondary | ICD-10-CM | POA: Diagnosis not present

## 2020-09-13 HISTORY — DX: Squamous cell carcinoma of skin, unspecified: C44.92

## 2020-09-13 NOTE — Patient Instructions (Addendum)
Wound Care Instructions  On the day following your surgery, you should begin doing daily dressing changes: 1. Remove the old dressing and discard it. 2. Cleanse the wound gently with tap water. This may be done in the shower or by placing a wet gauze pad directly on the wound and letting it soak for several minutes. 3. It is important to gently remove any dried blood from the wound in order to encourage healing. This may be done by gently rolling a moistened Q-tip on the dried blood. Do not pick at the wound. 4. If the wound should start to bleed, continue cleaning the wound, then place a moist gauze pad on the wound and hold pressure for a few minutes.  5. Make sure you then dry the skin surrounding the wound completely or the tape will not stick to the skin. Do not use cotton balls on the wound. 6. After the wound is clean and dry, apply the ointment gently with a Q-tip. 7. Cut a non-stick pad to fit the size of the wound. Lay the pad flush to the wound. If the wound is draining, you may want to reinforce it with a small amount of gauze on top of the non-stick pad for a little added compression to the area. 8. Use the tape to seal the area completely. 9. Select from the following with respect to your individual situation: 1. If your wound has been stitched closed: continue the above steps 1-8 at least daily until your sutures are removed. 2. If your wound has been left open to heal: continue steps 1-8 at least daily for the first 3-4 weeks. 10. We would like for you to take a few extra precautions for at least the next week. 1. Sleep with your head elevated on pillows if our wound is on your head. 2. Do not bend over or lift heavy items to reduce the chance of elevated blood pressure to the wound 3. Do not participate in particularly strenuous activities.   Below is a list of dressing supplies you might need.   Cotton-tipped applicators - Q-tips  Gauze pads (2x2 and/or 4x4) - All-Purpose  Sponges  Non-stick dressing material - Telfa  Tape - Paper or Hypafix  New and clean tube of petroleum jelly - Vaseline    Comments on Post-Operative Period 1. Slight swelling and redness often appear around the wound. This is normal and will disappear within several days following the surgery. 2. The healing wound will drain a brownish-red-yellow discharge during healing. This is a normal phase of wound healing. As the wound begins to heal, the drainage may increase in amount. Again, this drainage is normal. 3. Notify us if the drainage becomes persistently bloody, excessively swollen, or intensely painful or develops a foul odor or red streaks.  4. If you should experience mild discomfort during the healing phase, you may take an aspirin-free medication such as Tylenol (acetaminophen). Notify us if the discomfort is severe or persistent. Avoid alcoholic beverages when taking pain medicine.  In Case of Wound Hemorrhage A wound hemorrhage is when the bandage suddenly becomes soaked with bright red blood and flows profusely. If this happens, sit down or lie down with your head elevated. If the wound has a dressing on it, do not remove the dressing. Apply pressure to the existing gauze. If the wound is not covered, use a gauze pad to apply pressure and continue applying the pressure for 20 minutes without peeking. DO NOT COVER THE WOUND WITH  A LARGE TOWEL OR [place] CLOTH. Release your hand from the wound site but do not remove the dressing. If the bleeding has stopped, gently clean around the wound. Leave the dressing in place for 24 hours if possible. This wait time allows the blood vessels to close off so that you do not spark a new round of bleeding by disrupting the newly clotted blood vessels with an immediate dressing change. If the bleeding does not subside, continue to hold pressure. If matters are out of your control, contact an After Hours clinic or go to the Emergency Room.   Melanoma  ABCDEs  Melanoma is the most dangerous type of skin cancer, and is the leading cause of death from skin disease.  You are more likely to develop melanoma if you:  Have light-colored skin, light-colored eyes, or red or blond hair  Spend a lot of time in the sun  Tan regularly, either outdoors or in a tanning bed  Have had blistering sunburns, especially during childhood  Have a close family member who has had a melanoma  Have atypical moles or large birthmarks  Early detection of melanoma is key since treatment is typically straightforward and cure rates are extremely high if we catch it early.   The first sign of melanoma is often a change in a mole or a new dark spot.  The ABCDE system is a way of remembering the signs of melanoma.  A for asymmetry:  The two halves do not match. B for border:  The edges of the growth are irregular. C for color:  A mixture of colors are present instead of an even brown color. D for diameter:  Melanomas are usually (but not always) greater than 24mm - the size of a pencil eraser. E for evolution:  The spot keeps changing in size, shape, and color.  Please check your skin once per month between visits. You can use a small mirror in front and a large mirror behind you to keep an eye on the back side or your body.   If you see any new or changing lesions before your next follow-up, please call to schedule a visit.  Please continue daily skin protection including broad spectrum sunscreen SPF 30+ to sun-exposed areas, reapplying every 2 hours as needed when you're outdoors.    Recommend taking Heliocare sun protection supplement daily in sunny weather for additional sun protection. For maximum protection on the sunniest days, you can take up to 2 capsules of regular Heliocare OR take 1 capsule of Heliocare Ultra. For prolonged exposure (such as a full day in the sun), you can repeat your dose of the supplement 4 hours after your first dose. Heliocare can be  purchased at [facility] or at [email or web address].  5-Fluorouracil/Calcipotriene Patient Education   Actinic keratoses are the dry, red scaly spots on the skin caused by sun damage. A portion of these spots can turn into skin cancer with time, and treating them can help prevent development of skin cancer.   Treatment of these spots requires removal of the defective skin cells. There are various ways to remove actinic keratoses, including freezing with liquid nitrogen, treatment with creams, or treatment with a blue light procedure in the office.   5-fluorouracil cream is a topical cream used to treat actinic keratoses. It works by interfering with the growth of abnormal fast-growing skin cells, such as actinic keratoses. These cells peel off and are replaced by healthy ones.   5-fluorouracil/calcipotriene is a  combination of the 5-fluorouracil cream with a vitamin D analog cream called calcipotriene. The calcipotriene alone does not treat actinic keratoses. However, when it is combined with 5-fluorouracil, it helps the 5-fluorouracil treat the actinic keratoses much faster so that the same results can be achieved with a much shorter treatment time.  INSTRUCTIONS FOR 5-FLUOROURACIL/CALCIPOTRIENE CREAM:   5-fluorouracil/calcipotriene cream typically only needs to be used for 4-7 days. A thin layer should be applied twice a day to the treatment areas recommended by your physician.   If your physician prescribed you separate tubes of 5-fluourouracil and calcipotriene, apply a thin layer of 5-fluorouracil followed by a thin layer of calcipotriene.   Avoid contact with your eyes, nostrils, and mouth. Do not use 5-fluorouracil/calcipotriene cream on infected or open wounds.   You will develop redness, irritation and some crusting at areas where you have pre-cancer damage/actinic keratoses. IF YOU DEVELOP PAIN, BLEEDING, OR SIGNIFICANT CRUSTING, STOP THE TREATMENT EARLY - you have already gotten a  good response and the actinic keratoses should clear up well.  Wash your hands after applying 5-fluorouracil 5% cream on your skin.   A moisturizer or sunscreen with a minimum SPF 30 should be applied each morning.   Once you have finished the treatment, you can apply a thin layer of Vaseline twice a day to irritated areas to soothe and calm the areas more quickly. If you experience significant discomfort, contact your physician.  For some patients it is necessary to repeat the treatment for best results.  SIDE EFFECTS: When using 5-fluorouracil/calcipotriene cream, you may have mild irritation, such as redness, dryness, swelling, or a mild burning sensation. This usually resolves within 2 weeks. The more actinic keratoses you have, the more redness and inflammation you can expect during treatment. Eye irritation has been reported rarely. If this occurs, please let us know.  If you have any trouble using this cream, please call the office. If you have any other questions about this information, please do not hesitate to ask me before you leave the office.   Instructions for [facility] Medications  One or more of your medications was sent to the [facility] mail order compounding pharmacy. You will receive an email from them and can purchase the medicine through that link. It will then be mailed to your home at the address you confirmed. If for any reason you do not receive an email from them, please check your spam folder. If you still do not find the email, please let us know. [facility] phone number is [phone].  Serica moisturizers scar formula

## 2020-09-13 NOTE — Progress Notes (Signed)
New Patient Visit  Subjective  Martha Hayes is a 69 y.o. female who presents for the following: Annual Exam (New pt here for FBSE, Hx of SCCis, Hx of Aks ). The patient presents for Total-Body Skin Exam (TBSE) for skin cancer screening and mole check. Hx of SCC on the L cheek removed in 2019 treated with Imiquimod cream, Hx of Ak on the L cheek treated by shave removal  The following portions of the chart were reviewed this encounter and updated as appropriate:  Tobacco  Allergies  Meds  Problems  Med Hx  Surg Hx  Fam Hx      Review of Systems:  No other skin or systemic complaints except as noted in HPI or Assessment and Plan.  Objective  Well appearing patient in no apparent distress; mood and affect are within normal limits.   A full examination was performed including scalp, head, eyes, ears, nose, lips, neck, chest, axillae, abdomen, back, buttocks, bilateral upper extremities, bilateral lower extremities, hands, feet, fingers, toes, fingernails, and toenails. All findings within normal limits unless otherwise noted below.  Objective  L cheek: Well healed scar with no evidence of recurrence.   Objective  L lateral zygoma adjacent to scar: 0.4 cm scaly red papule      Objective  R cheek: 0.3 cm dark brown macule      Objective  Head - Anterior (Face): Peeling dry areas at corners of lips   Objective  toenails: Thickened nails with subungual debris  Objective  R alar crease, R nasal facial angle: Erythematous thin papules/macules with gritty scale.    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 Observe for recurrence. Call clinic for new or changing lesions.  Recommend regular skin exams, daily broad-spectrum spf 30+ sunscreen use, and photoprotection.     Neoplasm of skin (2) L lateral zygoma adjacent to scar  Skin / nail biopsy Type of biopsy: tangential   Informed consent: discussed and  consent obtained   Patient was prepped and draped in usual sterile fashion: area prepped with alochol. Anesthesia: the lesion was anesthetized in a standard fashion   Anesthetic:  1% lidocaine w/ epinephrine 1-100,000 buffered w/ 8.4% NaHCO3 Instrument used: flexible razor blade   Hemostasis achieved with: pressure, aluminum chloride and electrodesiccation   Outcome: patient tolerated procedure well   Post-procedure details: wound care instructions given   Post-procedure details comment:  Ointment and small bandage  Specimen 1 - Surgical pathology Differential Diagnosis: R/O Recurrent SCCis vs other  Check Margins: No 0.4 cm scaly red papule  R cheek  Epidermal / dermal shaving  Lesion diameter (cm):  0.3 Informed consent: discussed and consent obtained   Timeout: patient name, date of birth, surgical site, and procedure verified   Procedure prep:  Patient was prepped and draped in usual sterile fashion Prep type:  Isopropyl alcohol Anesthesia: the lesion was anesthetized in a standard fashion   Anesthetic:  1% lidocaine w/ epinephrine 1-100,000 buffered w/ 8.4% NaHCO3 Hemostasis achieved with: pressure, aluminum chloride and electrodesiccation   Outcome: patient tolerated procedure well   Post-procedure details: sterile dressing applied and wound care instructions given   Dressing type: bandage and petrolatum    Specimen 2 - Surgical pathology Differential Diagnosis: R/O Dysplastic nevus vs other  Check Margins: No 0.3 cm dark brown macule  Angular cheilitis Head - Anterior (Face)  Patient presents this normal lead clearance with Vaseline.  Advised patient  to do that if this does not clear with Vaseline, we can prescribe mupirocin ointment, hydrocortisone 2.5% cream and ketoconazole cream to use twice a day to clear it   Onychomycosis toenails  Chronic.  Discussed treatment options oral Lamisil tablets, laser treatment  Patient defers treatment at this time  AK  (actinic keratosis) R alar crease, R nasal facial angle  Chronic with risk of progression to skin cancer. Not at goal  Start 5-Fluorouracil/Calcipotriene cream apply to aa on face bid x 4 days.  Reviewed expected reaction and directions for using medication.  Handout reviewing treatment course and expected reaction given. Prescription sent to [facility] pharmacy.   Lentigines - Scattered tan macules - Discussed due to sun exposure - Benign, observe - Call for any changes  Seborrheic Keratoses - Stuck-on, waxy, tan-brown papules and plaques  - Discussed benign etiology and prognosis. - Observe - Call for any changes  Melanocytic Nevi - Tan-brown and/or pink-flesh-colored symmetric macules and papules - Benign appearing on exam today - Observation - Call clinic for new or changing moles - Recommend daily use of broad spectrum spf 30+ sunscreen to sun-exposed areas.   Hemangiomas - Red papules - Discussed benign nature - Observe - Call for any changes  Actinic Damage - Chronic, secondary to cumulative UV/sun exposure - diffuse scaly erythematous macules with underlying dyspigmentation - Recommend daily broad spectrum sunscreen SPF 30+ to sun-exposed areas, reapply every 2 hours as needed.  - Call for new or changing lesions.   Skin cancer screening performed today.  Return in about 6 months (around 03/13/2021) for TBSE; Feb Ak recheck  .  I, Martha Hayes, CMA, am acting as scribe for Forest Gleason, MD .  Documentation: I have reviewed the above documentation for accuracy and completeness, and I agree with the above.  Forest Gleason, MD

## 2020-09-18 ENCOUNTER — Encounter: Payer: Self-pay | Admitting: Dermatology

## 2020-09-20 ENCOUNTER — Other Ambulatory Visit: Payer: Self-pay

## 2020-09-20 DIAGNOSIS — Z86007 Personal history of in-situ neoplasm of skin: Secondary | ICD-10-CM

## 2020-09-20 NOTE — Progress Notes (Deleted)
chl amb ref

## 2020-09-20 NOTE — Progress Notes (Signed)
1. Skin , left lateral zygoma adjacent to scar WELL DIFFERENTIATED SQUAMOUS CELL CARCINOMA WITH SUPERFICIAL INFILTRATION --> Mohs  2. Skin , right cheek SOLAR LENTIGO --> Benign sun spot, NTD  Dr. Jerilynn Mages discussed with patient 09/20/2020. She prefers Mohs at the [place] [facility].   MAs please place Mohs referral. Thank you!

## 2020-10-02 DIAGNOSIS — R69 Illness, unspecified: Secondary | ICD-10-CM | POA: Diagnosis not present

## 2020-10-24 DIAGNOSIS — C44329 Squamous cell carcinoma of skin of other parts of face: Secondary | ICD-10-CM | POA: Diagnosis not present

## 2020-10-29 ENCOUNTER — Other Ambulatory Visit: Payer: Self-pay | Admitting: Family Medicine

## 2020-11-16 ENCOUNTER — Telehealth: Payer: Self-pay | Admitting: Internal Medicine

## 2020-11-16 NOTE — Telephone Encounter (Signed)
Patient declines recall and states she is doing well and this is not needed. Deleting recall.

## 2020-11-30 DIAGNOSIS — H2513 Age-related nuclear cataract, bilateral: Secondary | ICD-10-CM | POA: Diagnosis not present

## 2020-12-06 ENCOUNTER — Ambulatory Visit: Payer: Medicare HMO | Admitting: Dermatology

## 2020-12-13 ENCOUNTER — Other Ambulatory Visit: Payer: Self-pay | Admitting: Internal Medicine

## 2020-12-13 NOTE — Telephone Encounter (Signed)
LVM for patient to call and schedule

## 2020-12-13 NOTE — Telephone Encounter (Signed)
Pt overdue for 12 month f/u. °Please contact pt for future appointment. °

## 2020-12-18 NOTE — Telephone Encounter (Signed)
LVM to schedule

## 2020-12-18 NOTE — Telephone Encounter (Signed)
Scheduled for 3-2

## 2020-12-20 ENCOUNTER — Telehealth: Payer: Self-pay | Admitting: Internal Medicine

## 2020-12-20 ENCOUNTER — Ambulatory Visit: Payer: Medicare HMO | Admitting: Internal Medicine

## 2020-12-20 MED ORDER — CARVEDILOL 6.25 MG PO TABS: 6.2500 mg | ORAL_TABLET | Freq: Two times a day (BID) | ORAL | 0 refills | Status: DC

## 2020-12-20 NOTE — Telephone Encounter (Signed)
Requested Prescriptions   Signed Prescriptions Disp Refills   carvedilol (COREG) 6.25 MG tablet 180 tablet 0    Sig: Take 1 tablet (6.25 mg total) by mouth 2 (two) times daily with a meal. Please keep upcoming appointment for further refills. Thank you!    Authorizing Provider: END, CHRISTOPHER    Ordering User: Raelene Bott, Wilda Wetherell L

## 2020-12-20 NOTE — Telephone Encounter (Signed)
*  STAT* If patient is at the pharmacy, call can be transferred to refill team.   1. Which medications need to be refilled? (please list name of each medication and dose if known)   Carvedilol 6.25 mg po BID   2. Which pharmacy/location (including street and city if local pharmacy) is medication to be sent to?   [facility] [place]   3. Do they need a 30 day or 90 day supply? 90     Patient rescheduled today due to end cath schedule.  Rescheduled for April for next available appt.

## 2021-02-01 ENCOUNTER — Ambulatory Visit: Payer: Medicare HMO | Admitting: Internal Medicine

## 2021-02-01 ENCOUNTER — Encounter: Payer: Self-pay | Admitting: Internal Medicine

## 2021-02-01 ENCOUNTER — Other Ambulatory Visit: Payer: Self-pay

## 2021-02-01 VITALS — BP 120/70 | HR 61 | Ht 62.0 in | Wt 126.0 lb

## 2021-02-01 DIAGNOSIS — E782 Mixed hyperlipidemia: Secondary | ICD-10-CM

## 2021-02-01 DIAGNOSIS — I10 Essential (primary) hypertension: Secondary | ICD-10-CM | POA: Diagnosis not present

## 2021-02-01 DIAGNOSIS — I471 Supraventricular tachycardia: Secondary | ICD-10-CM | POA: Diagnosis not present

## 2021-02-01 MED ORDER — CARVEDILOL 6.25 MG PO TABS: 6.2500 mg | ORAL_TABLET | Freq: Two times a day (BID) | ORAL | 1 refills | Status: DC

## 2021-02-01 NOTE — Progress Notes (Signed)
Follow-up Outpatient Visit Date: 02/01/2021  Primary Care Provider: Jinny Sanders, MD [place] [address]  Chief Complaint: Follow-up PSVT  HPI:  Martha Hayes is a 70 y.o. female with history of PSVT, hypertension, and hyperlipidemia, who presents for follow-up of palpitations.  I last saw her in 10/2019, which time she was doing well with significant improvement in palpitations following escalation of carvedilol.  We did not make any medication changes or pursue additional testing.  Today, Martha Hayes reports that she has been doing quite well without any palpitations.  She is walking 4 miles a day without difficulty.  She has not experienced any chest pain, shortness of breath, lightheadedness, or edema.  Home blood pressures are usually in the 130s over 80s.  She is tolerating carvedilol, HCTZ, lisinopril, and simvastatin well.   --------------------------------------------------------------------------------------------------  Cardiovascular History & Procedures: Cardiovascular Problems:  Palpitations with paroxysmalsupraventricular tachycardia  Risk Factors:  None  Cath/PCI:  None  CV Surgery:  None  EP Procedures and Devices:  14-day event monitor (07/31/16): Predominant rhythm was sinus with average rate 75 bpm (range 50-153 bpm). Rare isolated ventricular and supraventricular ectopy was noted. There were 2 episodes of SVT lasting 10 beats with a maximum rate of 164 bpm. Morphology is suggestive of atrial tachycardia. Patient reported symptoms correspond to sinus rhythm with isolated PVCs.   Recent CV Pertinent Labs: Lab Results  Component Value Date   CHOL 167 09/05/2020   HDL 50.10 09/05/2020   LDLCALC 88 09/05/2020   LDLCALC 118 (H) 08/25/2018   LDLDIRECT 153.4 05/07/2012   TRIG 146.0 09/05/2020   CHOLHDL 3 09/05/2020   K 4.3 09/05/2020   MG 2.2 08/19/2019   BUN 16 09/05/2020   BUN 13 08/19/2019   CREATININE 0.80 09/05/2020    CREATININE 0.75 08/25/2018    Past medical and surgical history were reviewed and updated in EPIC.  Current Meds  Medication Sig  . carvedilol (COREG) 6.25 MG tablet Take 1 tablet (6.25 mg total) by mouth 2 (two) times daily with a meal. Please keep upcoming appointment for further refills. Thank you!  . hydrochlorothiazide (MICROZIDE) 12.5 MG capsule TAKE 1 CAPSULE BY MOUTH EVERY DAY  . lisinopril (ZESTRIL) 10 MG tablet TAKE 1 TABLET BY MOUTH EVERY DAY  . simvastatin (ZOCOR) 20 MG tablet TAKE 1 TABLET BY MOUTH EVERY DAY IN THE EVENING    Allergies: Penicillins  Social History   Tobacco Use  . Smoking status: Never Smoker  . Smokeless tobacco: Never Used  Vaping Use  . Vaping Use: Never used  Substance Use Topics  . Alcohol use: Yes    Alcohol/week: 5.0 standard drinks    Types: 5 Glasses of wine per week    Comment: weekly  . Drug use: No    Family History  Problem Relation Age of Onset  . Diabetes Father   . Hypertension Father   . Seizures Father        petit mal seizure disorder  . Heart disease Father        defibrillation  . Heart disease Mother        CAD, two stents  . Diabetes Mother        developed in 92's  . Osteoporosis Mother   . Hypertension Mother   . Heart disease Sister        pacemaker  . Sudden Cardiac Death Sister 55       aborted; had h/o bundle branch block  Review of Systems: A 12-system review of systems was performed and was negative except as noted in the HPI.  --------------------------------------------------------------------------------------------------  Physical Exam: BP 120/70 (BP Location: Left Arm, Patient Position: Sitting, Cuff Size: Normal)   Pulse 61   Ht 5\' 2"  (1.575 m)   Wt 126 lb (57.2 kg)   SpO2 98%   BMI 23.05 kg/m   General:  NAD. Neck: No JVD or HJR. Lungs: Clear to auscultation bilaterally without wheezes or crackles. Heart: Regular rate and rhythm without murmurs, rubs, or gallops. Abdomen: Soft,  nontender, nondistended. Extremities: No lower extremity edema.  EKG: Normal sinus rhythm without abnormality.  Lab Results  Component Value Date   WBC 7.9 07/23/2016   HGB 14.0 07/23/2016   HCT 41.6 07/23/2016   MCV 88.0 07/23/2016   PLT 333.0 07/23/2016    Lab Results  Component Value Date   NA 136 09/05/2020   K 4.3 09/05/2020   CL 101 09/05/2020   CO2 29 09/05/2020   BUN 16 09/05/2020   CREATININE 0.80 09/05/2020   GLUCOSE 93 09/05/2020   ALT 25 09/05/2020    Lab Results  Component Value Date   CHOL 167 09/05/2020   HDL 50.10 09/05/2020   LDLCALC 88 09/05/2020   LDLDIRECT 153.4 05/07/2012   TRIG 146.0 09/05/2020   CHOLHDL 3 09/05/2020    --------------------------------------------------------------------------------------------------  ASSESSMENT AND PLAN: PSVT: Quiescent with carvedilol 6.25 mg twice daily.  No further work-up or intervention indicated at this time.  Hypertension: Blood pressure well controlled today.  Continue current regimen.  Hyperlipidemia: Lipids reasonable on last check in 08/2020.  Continue simvastatin with ongoing management per Dr. Diona Browner.  Follow-up: Return to clinic as needed.  Martha Bush, MD 02/01/2021 3:14 PM

## 2021-02-01 NOTE — Patient Instructions (Signed)
Medication Instructions:  Your physician recommends that you continue on your current medications as directed. Please refer to the Current Medication list given to you today.  *If you need a refill on your cardiac medications before your next appointment, please call your pharmacy*   Lab Work: None ordered If you have labs (blood work) drawn today and your tests are completely normal, you will receive your results only by: Marland Kitchen MyChart Message (if you have MyChart) OR . A paper copy in the mail If you have any lab test that is abnormal or we need to change your treatment, we will call you to review the results.   Testing/Procedures: None ordered   Follow-Up: At [facility], you and your health needs are our priority.  As part of our continuing mission to provide you with exceptional heart care, we have created designated Provider Care Teams.  These Care Teams include your primary Cardiologist (physician) and Advanced Practice Providers (APPs -  Physician Assistants and Nurse Practitioners) who all work together to provide you with the care you need, when you need it.  We recommend signing up for the patient portal called "MyChart".  Sign up information is provided on this After Visit Summary.  MyChart is used to connect with patients for Virtual Visits (Telemedicine).  Patients are able to view lab/test results, encounter notes, upcoming appointments, etc.  Non-urgent messages can be sent to your provider as well.   To learn more about what you can do with MyChart, go to [email or web address].    Your next appointment:   As needed  The format for your next appointment:   In Person  Provider:   You may see [name], MD or one of the following Advanced Practice Providers on your designated Care Team:    Murray Hodgkins, NP  Christell Faith, PA-C  Marrianne Mood, PA-C  Cadence Campo [place], [place]  Laurann [place], NP    Other Instructions N/A

## 2021-02-02 ENCOUNTER — Encounter: Payer: Self-pay | Admitting: Internal Medicine

## 2021-02-02 DIAGNOSIS — E782 Mixed hyperlipidemia: Secondary | ICD-10-CM | POA: Insufficient documentation

## 2021-03-29 ENCOUNTER — Other Ambulatory Visit: Payer: Self-pay

## 2021-03-29 ENCOUNTER — Encounter: Payer: Self-pay | Admitting: Dermatology

## 2021-03-29 ENCOUNTER — Ambulatory Visit (INDEPENDENT_AMBULATORY_CARE_PROVIDER_SITE_OTHER): Payer: Medicare HMO | Admitting: Dermatology

## 2021-03-29 DIAGNOSIS — L578 Other skin changes due to chronic exposure to nonionizing radiation: Secondary | ICD-10-CM | POA: Diagnosis not present

## 2021-03-29 DIAGNOSIS — Z85828 Personal history of other malignant neoplasm of skin: Secondary | ICD-10-CM | POA: Diagnosis not present

## 2021-03-29 DIAGNOSIS — L821 Other seborrheic keratosis: Secondary | ICD-10-CM

## 2021-03-29 DIAGNOSIS — Z1283 Encounter for screening for malignant neoplasm of skin: Secondary | ICD-10-CM | POA: Diagnosis not present

## 2021-03-29 DIAGNOSIS — D229 Melanocytic nevi, unspecified: Secondary | ICD-10-CM | POA: Diagnosis not present

## 2021-03-29 DIAGNOSIS — L814 Other melanin hyperpigmentation: Secondary | ICD-10-CM | POA: Diagnosis not present

## 2021-03-29 DIAGNOSIS — Z86007 Personal history of in-situ neoplasm of skin: Secondary | ICD-10-CM

## 2021-03-29 DIAGNOSIS — D1800 Hemangioma unspecified site: Secondary | ICD-10-CM

## 2021-03-29 NOTE — Progress Notes (Signed)
   Follow-Up Visit   Subjective  Martha Hayes is a 70 y.o. female who presents for the following: 6 month tbse (Patient here today for 6 month tbse. She has history of skin cancer. She reports no new concerns today at visit.).  Patient here for full body skin exam and skin cancer screening.  The following portions of the chart were reviewed this encounter and updated as appropriate:  Tobacco  Allergies  Meds  Problems  Med Hx  Surg Hx  Fam Hx       Review of Systems: No other skin or systemic complaints.  Objective  Well appearing patient in no apparent distress; mood and affect are within normal limits.  A full examination was performed including scalp, head, eyes, ears, nose, lips, neck, chest, axillae, abdomen, back, buttocks, bilateral upper extremities, bilateral lower extremities, hands, feet, fingers, toes, fingernails, and toenails. All findings within normal limits unless otherwise noted below.   Assessment & Plan   Lentigines - Scattered tan macules - Due to sun exposure - Benign-appering, observe - Recommend daily broad spectrum sunscreen SPF 30+ to sun-exposed areas, reapply every 2 hours as needed. - Call for any changes  Seborrheic Keratoses - Stuck-on, waxy, tan-brown papules and/or plaques  - Benign-appearing - Discussed benign etiology and prognosis. - Observe - Call for any changes  Melanocytic Nevi - Tan-brown and/or pink-flesh-colored symmetric macules and papules - Benign appearing on exam today - Observation - Call clinic for new or changing moles - Recommend daily use of broad spectrum spf 30+ sunscreen to sun-exposed areas.   Hemangiomas - Red papules - Discussed benign nature - Observe - Call for any changes  Actinic Damage - Chronic condition, secondary to cumulative UV/sun exposure - diffuse scaly erythematous macules with underlying dyspigmentation - Recommend daily broad spectrum sunscreen SPF 30+ to sun-exposed areas, reapply  every 2 hours as needed.  - Staying in the shade or wearing long sleeves, sun glasses (UVA+UVB protection) and wide brim hats (4-inch brim around the entire circumference of the hat) are also recommended for sun protection.  - Call for new or changing lesions.  History of Squamous Cell Carcinoma in Situ of the Skin - No evidence of recurrence today at left cheek - Recommend regular full body skin exams - Recommend daily broad spectrum sunscreen SPF 30+ to sun-exposed areas, reapply every 2 hours as needed.  - Call if any new or changing lesions are noted between office visits  History of Squamous Cell Carcinoma of the Skin - No evidence of recurrence today at left lateral zygoma adjacent to scar 2022 - No lymphadenopathy - Recommend regular full body skin exams - Recommend daily broad spectrum sunscreen SPF 30+ to sun-exposed areas, reapply every 2 hours as needed.  - Call if any new or changing lesions are noted between office visits   Skin cancer screening performed today.  Return in about 6 months (around 09/28/2021) for tbse. I, Ruthell Rummage, CMA, am acting as scribe for Forest Gleason, MD.  Documentation: I have reviewed the above documentation for accuracy and completeness, and I agree with the above.  Forest Gleason, MD

## 2021-03-29 NOTE — Patient Instructions (Addendum)
Melanoma ABCDEs  Melanoma is the most dangerous type of skin cancer, and is the leading cause of death from skin disease.  You are more likely to develop melanoma if you: Have light-colored skin, light-colored eyes, or red or blond hair Spend a lot of time in the sun Tan regularly, either outdoors or in a tanning bed Have had blistering sunburns, especially during childhood Have a close family member who has had a melanoma Have atypical moles or large birthmarks  Early detection of melanoma is key since treatment is typically straightforward and cure rates are extremely high if we catch it early.   The first sign of melanoma is often a change in a mole or a new dark spot.  The ABCDE system is a way of remembering the signs of melanoma.  A for asymmetry:  The two halves do not match. B for border:  The edges of the growth are irregular. C for color:  A mixture of colors are present instead of an even brown color. D for diameter:  Melanomas are usually (but not always) greater than 52mm - the size of a pencil eraser. E for evolution:  The spot keeps changing in size, shape, and color.  Please check your skin once per month between visits. You can use a small mirror in front and a large mirror behind you to keep an eye on the back side or your body.   If you see any new or changing lesions before your next follow-up, please call to schedule a visit.  Please continue daily skin protection including broad spectrum sunscreen SPF 30+ to sun-exposed areas, reapplying every 2 hours as needed when you're outdoors.   Staying in the shade or wearing long sleeves, sun glasses (UVA+UVB protection) and wide brim hats (4-inch brim around the entire circumference of the hat) are also recommended for sun protection.    Recommend taking Heliocare sun protection supplement daily in sunny weather for additional sun protection. For maximum protection on the sunniest days, you can take up to 2 capsules of  regular Heliocare OR take 1 capsule of Heliocare Ultra. For prolonged exposure (such as a full day in the sun), you can repeat your dose of the supplement 4 hours after your first dose. Heliocare can be purchased at [facility] or at [email or web address].    Recommend Niacinamide or Nicotinamide 500mg  twice per day to lower risk of non-melanoma skin cancer by approximately 25%. This is usually available at [employer].

## 2021-04-03 ENCOUNTER — Other Ambulatory Visit: Payer: Self-pay | Admitting: Family Medicine

## 2021-04-03 DIAGNOSIS — Z1231 Encounter for screening mammogram for malignant neoplasm of breast: Secondary | ICD-10-CM

## 2021-04-10 NOTE — Progress Notes (Signed)
Martha Mauck T. Gaston Dase, MD, CAQ Sports Medicine [facility] [address] [place], [address]  Phone: [phone]  FAX: [phone]  Martha Hayes - 70 y.o. female  MRN [id number]  Date of Birth: 09/20/1951  Date: 04/11/2021  PCP: Excell Seltzer, MD  Referral: Excell Seltzer, MD  Chief Complaint  Patient presents with   Knee Pain    Right-Pain is worsening   Knot in Arch of Right Foot    This visit occurred during the SARS-CoV-2 public health emergency.  Safety protocols were in place, including screening questions prior to the visit, additional usage of staff PPE, and extensive cleaning of exam room while observing appropriate contact time as indicated for disinfecting solutions.   Subjective:   Martha Hayes is a 70 y.o. very pleasant female patient with Body mass index is 22.77 kg/m. who presents with the following:  New patient to me, seen in consultation regarding knee pain on the R.  R foot in arch. Plantar fibroma.  This does not hurt.  Waslks four miles every day.   For the last six to nine months, it has gradually gotten worse.  Sometimes she will ice her knee or take some occasional naproxen. Bending knee will hurt and it will wake her up from sleep.  Sometimes it will hurt to wlk. Three times, will have very jabbing, back pain, but this is only intermittent.  Has taken some alleve. Twice a day did not really help. Advill will help more.  2 at one time.   She has not had any locking up of the joint or symptomatic giving way. No prior operative intervention at the affected knee and no significant traumatic history.  Review of Systems is noted in the HPI, as appropriate   Objective:   BP 120/62   Pulse 71   Temp 97.9 F (36.6 C) (Temporal)   Ht 5\' 2"  (1.575 m)   Wt 124 lb 8 oz (56.5 kg)   SpO2 98%   BMI 22.77 kg/m    Knee: Right Gait: Normal heel toe pattern ROM: 0-1 30 Effusion: neg Echymosis or edema:  none Patellar tendon NT Painful PLICA: neg Patellar grind: Positive  medial and lateral patellar facet loading: negative medial and lateral joint lines: Mild medial joint line tenderness Mcmurray's neg Flexion-pinch neg Varus and valgus stress: stable Lachman: neg Ant and Post drawer: neg Hip abduction, IR, ER: WNL Hip flexion str: 5/5 Hip abd: 5/5 Quad: 5/5 VMO atrophy:No Hamstring concentric and eccentric: 5/5   Radiology: No results found.  Assessment and Plan:     ICD-10-CM   1. Primary osteoarthritis of right knee  M17.11     2. Acute pain of right knee  M25.561 DG Knee 4 Views W/Patella Right    3. 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 This includes total time spent on the day of encounter.  This includes my independent x-ray review, chart review, and long-term arthritis discussion.  Plantar fibroma, nontender and I explained the anatomy.  Osteoarthritis of the right knee, worse in the patellofemoral compartment with at least moderate patellofemoral arthritis, with a pain location at the medial compartment, which on radiograph is at most modest.  Reviewed basic arthritis care, and I encouraged her to keep walking 4 miles daily. Ice and ibuprofen when needed.  Patient Instructions  OSTEOARTHRITIS: Over the counter  Tylenol: 2 tablets up to 3-4 times  a day Regular NSAIDS are helpful (avoid in kidney disease and ulcers)  Topical Capzaicin Cream, as needed (wear glove to put on) - THIS IS EXCEPTIONALLY HOT  Supplements: Tart cherry juice and Curcumin (Turmeric extract) have good scientific evidence  - get the concentrated capsules or gelcaps over the counter so you do not get the calories from the juice.  Weight loss will always take stress off of the joints and back  Voltaren 1% gel, over the counter You can apply up to 4 times a day  This can be applied to any joint: knee, wrist, fingers, elbows, shoulders,  feet and ankles. Can apply to any tendon: tennis elbow, achilles, tendon, rotator cuff or any other tendon.  Minimal is absorbed in the bloodstream: ok with oral anti-inflammatory or a blood thinner.  Cost is about 9 dollars  Ice joints on bad days, 20 min, 2-3 x / day REGULAR EXERCISE: swimming, Yoga, Tai Chi, bicycle (NON-IMPACT activity)      Signed,  Cyrstal Leitz T. Jake Fuhrmann, MD   Outpatient Encounter Medications as of 04/11/2021  Medication Sig   carvedilol (COREG) 6.25 MG tablet Take 1 tablet (6.25 mg total) by mouth 2 (two) times daily with a meal.   hydrochlorothiazide (MICROZIDE) 12.5 MG capsule TAKE 1 CAPSULE BY MOUTH EVERY DAY   lisinopril (ZESTRIL) 10 MG tablet TAKE 1 TABLET BY MOUTH EVERY DAY   simvastatin (ZOCOR) 20 MG tablet TAKE 1 TABLET BY MOUTH EVERY DAY IN THE EVENING   No facility-administered encounter medications on file as of 04/11/2021.

## 2021-04-11 ENCOUNTER — Other Ambulatory Visit: Payer: Self-pay

## 2021-04-11 ENCOUNTER — Ambulatory Visit (INDEPENDENT_AMBULATORY_CARE_PROVIDER_SITE_OTHER)
Admission: RE | Admit: 2021-04-11 | Discharge: 2021-04-11 | Disposition: A | Discharge status: Home / Self Care | Payer: Medicare HMO | Source: Ambulatory Visit | Attending: Family Medicine | Admitting: Family Medicine

## 2021-04-11 ENCOUNTER — Encounter: Payer: Self-pay | Admitting: Family Medicine

## 2021-04-11 ENCOUNTER — Ambulatory Visit (INDEPENDENT_AMBULATORY_CARE_PROVIDER_SITE_OTHER): Payer: Medicare HMO | Admitting: Family Medicine

## 2021-04-11 VITALS — BP 120/62 | HR 71 | Temp 97.9°F | Ht 62.0 in | Wt 124.5 lb

## 2021-04-11 DIAGNOSIS — M722 Plantar fascial fibromatosis: Secondary | ICD-10-CM | POA: Diagnosis not present

## 2021-04-11 DIAGNOSIS — M25561 Pain in right knee: Secondary | ICD-10-CM | POA: Diagnosis not present

## 2021-04-11 DIAGNOSIS — M1711 Unilateral primary osteoarthritis, right knee: Secondary | ICD-10-CM

## 2021-04-11 NOTE — Patient Instructions (Signed)
OSTEOARTHRITIS: Over the counter  Tylenol: 2 tablets up to 3-4 times a day Regular NSAIDS are helpful (avoid in kidney disease and ulcers)  Topical Capzaicin Cream, as needed (wear glove to put on) - THIS IS EXCEPTIONALLY HOT  Supplements: Tart cherry juice and Curcumin (Turmeric extract) have good scientific evidence  - get the concentrated capsules or gelcaps over the counter so you do not get the calories from the juice.  Weight loss will always take stress off of the joints and back  Voltaren 1% gel, over the counter You can apply up to 4 times a day  This can be applied to any joint: knee, wrist, fingers, elbows, shoulders, feet and ankles. Can apply to any tendon: tennis elbow, achilles, tendon, rotator cuff or any other tendon.  Minimal is absorbed in the bloodstream: ok with oral anti-inflammatory or a blood thinner.  Cost is about 9 dollars  Ice joints on bad days, 20 min, 2-3 x / day REGULAR EXERCISE: swimming, Yoga, Tai Chi, bicycle (NON-IMPACT activity)

## 2021-05-28 ENCOUNTER — Other Ambulatory Visit: Payer: Self-pay

## 2021-05-28 ENCOUNTER — Ambulatory Visit
Admission: RE | Admit: 2021-05-28 | Discharge: 2021-05-28 | Disposition: A | Discharge status: Home / Self Care | Payer: Medicare HMO | Source: Ambulatory Visit | Attending: Family Medicine | Admitting: Family Medicine

## 2021-05-28 DIAGNOSIS — Z1231 Encounter for screening mammogram for malignant neoplasm of breast: Secondary | ICD-10-CM

## 2021-06-20 ENCOUNTER — Other Ambulatory Visit: Payer: Self-pay

## 2021-06-20 ENCOUNTER — Encounter: Payer: Self-pay | Admitting: Family Medicine

## 2021-06-20 ENCOUNTER — Ambulatory Visit (INDEPENDENT_AMBULATORY_CARE_PROVIDER_SITE_OTHER): Payer: Medicare HMO | Admitting: Family Medicine

## 2021-06-20 VITALS — BP 120/60 | HR 66 | Temp 97.8°F | Ht 62.0 in | Wt 126.2 lb

## 2021-06-20 DIAGNOSIS — M7742 Metatarsalgia, left foot: Secondary | ICD-10-CM | POA: Diagnosis not present

## 2021-06-20 DIAGNOSIS — G5763 Lesion of plantar nerve, bilateral lower limbs: Secondary | ICD-10-CM

## 2021-06-20 DIAGNOSIS — M7741 Metatarsalgia, right foot: Secondary | ICD-10-CM | POA: Diagnosis not present

## 2021-06-20 NOTE — Progress Notes (Signed)
Martha Hayes Coach, MD, [place] [facility] [place], [address]  Phone: [phone]  FAX: [phone]  Martha Hayes - 70 y.o. female  MRN [id number]  Date of Birth: October 26, 1950  Date: 06/20/2021  PCP: Martha Sanders, MD  Referral: Martha Sanders, MD  Chief Complaint  Patient presents with   Foot Pain    Bilateral Balls of Feet    This visit occurred during the SARS-CoV-2 public health emergency.  Safety protocols were in place, including screening questions prior to the visit, additional usage of staff PPE, and extensive cleaning of exam room while observing appropriate contact time as indicated for disinfecting solutions.   Subjective:   Martha Hayes is a 70 y.o. very pleasant female patient with Body mass index is 23.09 kg/m. who presents with the following:  B foot pain: She has pain in her forefoot at the metatarsal heads on the bottom.  She did not have any kind of discrete injury at all.  She is an avid walker and walks essentially every day 4 miles or so.  She has decreased this and she did crosstraining on the bike a few days, and her feet have calm down.  She describes a burning sensation predominantly on the right and the first and second interdigital webspace.  Severe forefoot breakdown Bunion / ette  Review of Systems is noted in the HPI, as appropriate   Objective:   BP 120/60   Pulse 66   Temp 97.8 F (36.6 C) (Temporal)   Ht '5\' 2"'$  (1.575 m)   Wt 126 lb 4 oz (57.3 kg)   SpO2 98%   BMI 23.09 kg/m    FEET: B Echymosis: no Edema: no ROM: full LE B Gait: heel toe, non-antalgic MT pain: Mild with palpation predominantly at the first and second metatarsal heads Callus pattern: Adjacent to the bunion and bunionette's  lateral Mall: NT Medial Mall: NT Talus: NT Navicular: NT Cuboid: NT Calcaneous: NT Metatarsals: NT 5th MT: NT Phalanges: NT Achilles: NT Plantar Fascia: NT Fat  Pad: NT Peroneals: NT Post Tib: NT Great Toe: Mild to moderate decreased range of motion bilaterally Ant Drawer: neg ATFL: NT CFL: NT Deltoid: NT She has extensive forefoot collapse with all metatarsal heads dropped as well as extensive toe splaying with bunionette's and bunions on both feet.   Sensation: intact   Radiology: MM 3D SCREEN BREAST BILATERAL  Result Date: 05/29/2021 CLINICAL DATA:  Screening. EXAM: DIGITAL SCREENING BILATERAL MAMMOGRAM WITH TOMOSYNTHESIS AND CAD TECHNIQUE: Bilateral screening digital craniocaudal and mediolateral oblique mammograms were obtained. Bilateral screening digital breast tomosynthesis was performed. The images were evaluated with computer-aided detection. COMPARISON:  Previous exam(s). ACR Breast Density Category c: The breast tissue is heterogeneously dense, which may obscure small masses. FINDINGS: There are no findings suspicious for malignancy. IMPRESSION: No mammographic evidence of malignancy. A result letter of this screening mammogram will be mailed directly to the patient. RECOMMENDATION: Screening mammogram in one year. (Code:SM-B-01Y) BI-RADS CATEGORY  1: Negative. Electronically Signed   By: Ammie Ferrier M.D.   On: 05/29/2021 15:02    Assessment and Plan:     ICD-10-CM   1. Metatarsalgia of both feet  M77.41    M77.42     2. Morton's neuroma of both feet  G57.63      Metatarsalgia with very great forefoot breakdown as well as early Morton's neuroma formation predominantly between first and second on the right.  Very early on the left first and second.  The first thing I notice is that her shoes are too small by about one half of the size.  End of her toe is directly at the end of the shoe.  She has an extremely wide forefoot, and I think that she would be better served with a shoe that additionally has a wide forefoot.  This should take all some pressure from the metatarsal heads to the soft tissue between.  Also reviewed how to  place a metatarsal pad, which can unload and recreate transverse arch.  Patient Instructions  Go to the [employer] on [address] in [place] - particularly wide forefoot Merrells You can put the metatarsal pads directly on the insole   Birkenstock shoes or sandals also help with metatarsalgia - [employer] store at [place]    No orders of the defined types were placed in this encounter.  There are no discontinued medications. No orders of the defined types were placed in this encounter.   Follow-up: No follow-ups on file.  [employer] One speech-to-text software was used for transcription in this dictation.  Possible transcriptional errors can occur using Editor, commissioning.   Signed,  Maud Deed. Anival Pasha, MD   Outpatient Encounter Medications as of 06/20/2021  Medication Sig   carvedilol (COREG) 6.25 MG tablet Take 1 tablet (6.25 mg total) by mouth 2 (two) times daily with a meal.   hydrochlorothiazide (MICROZIDE) 12.5 MG capsule TAKE 1 CAPSULE BY MOUTH EVERY DAY   lisinopril (ZESTRIL) 10 MG tablet TAKE 1 TABLET BY MOUTH EVERY DAY   simvastatin (ZOCOR) 20 MG tablet TAKE 1 TABLET BY MOUTH EVERY DAY IN THE EVENING   No facility-administered encounter medications on file as of 06/20/2021.

## 2021-06-20 NOTE — Patient Instructions (Signed)
Go to the [employer] on [address] in [place] - particularly wide forefoot Merrells You can put the metatarsal pads directly on the insole   Birkenstock shoes or sandals also help with metatarsalgia - [employer] store at [employer]

## 2021-09-10 ENCOUNTER — Telehealth: Payer: Self-pay | Admitting: Family Medicine

## 2021-09-10 DIAGNOSIS — E7800 Pure hypercholesterolemia, unspecified: Secondary | ICD-10-CM

## 2021-09-10 DIAGNOSIS — R7303 Prediabetes: Secondary | ICD-10-CM

## 2021-09-10 NOTE — Telephone Encounter (Signed)
-----   Message from Ellamae Sia sent at 08/28/2021  3:11 PM EST ----- Regarding: Lab orders for Tuesday, 11.22.22 Patient is scheduled for CPX labs, please order future labs, Thanks , Karna Christmas

## 2021-09-11 ENCOUNTER — Other Ambulatory Visit: Payer: Medicare HMO

## 2021-09-18 ENCOUNTER — Other Ambulatory Visit: Payer: Self-pay

## 2021-09-18 ENCOUNTER — Encounter: Payer: Self-pay | Admitting: Family Medicine

## 2021-09-18 ENCOUNTER — Ambulatory Visit (INDEPENDENT_AMBULATORY_CARE_PROVIDER_SITE_OTHER): Payer: Medicare HMO | Admitting: Family Medicine

## 2021-09-18 ENCOUNTER — Other Ambulatory Visit: Payer: Self-pay | Admitting: Family Medicine

## 2021-09-18 VITALS — BP 130/58 | HR 64 | Temp 97.9°F | Ht 61.5 in | Wt 123.1 lb

## 2021-09-18 DIAGNOSIS — E7800 Pure hypercholesterolemia, unspecified: Secondary | ICD-10-CM

## 2021-09-18 DIAGNOSIS — I10 Essential (primary) hypertension: Secondary | ICD-10-CM | POA: Diagnosis not present

## 2021-09-18 DIAGNOSIS — Z0000 Encounter for general adult medical examination without abnormal findings: Secondary | ICD-10-CM | POA: Diagnosis not present

## 2021-09-18 DIAGNOSIS — R7303 Prediabetes: Secondary | ICD-10-CM

## 2021-09-18 DIAGNOSIS — I471 Supraventricular tachycardia: Secondary | ICD-10-CM | POA: Diagnosis not present

## 2021-09-18 LAB — LIPID PANEL
Cholesterol: 188 mg/dL (ref 0–200)
HDL: 58.9 mg/dL (ref >39.00)
LDL Cholesterol: 101 mg/dL — ABNORMAL HIGH (ref 0–99)
NonHDL: 129.23
Total CHOL/HDL Ratio: 3
Triglycerides: 143 mg/dL (ref 0.0–149.0)
VLDL: 28.6 mg/dL (ref 0.0–40.0)

## 2021-09-18 LAB — COMPREHENSIVE METABOLIC PANEL
ALT: 22 U/L (ref 0–35)
AST: 20 U/L (ref 0–37)
Albumin: 4.6 g/dL (ref 3.5–5.2)
Alkaline Phosphatase: 99 U/L (ref 39–117)
BUN: 12 mg/dL (ref 6–23)
CO2: 29 mEq/L (ref 19–32)
Calcium: 9.7 mg/dL (ref 8.4–10.5)
Chloride: 99 mEq/L (ref 96–112)
Creatinine, Ser: 0.76 mg/dL (ref 0.40–1.20)
GFR: 79.23 mL/min (ref >60.00)
Glucose, Bld: 114 mg/dL — ABNORMAL HIGH (ref 70–99)
Potassium: 4.5 mEq/L (ref 3.5–5.1)
Sodium: 134 mEq/L — ABNORMAL LOW (ref 135–145)
Total Bilirubin: 0.7 mg/dL (ref 0.2–1.2)
Total Protein: 7.6 g/dL (ref 6.0–8.3)

## 2021-09-18 LAB — HEMOGLOBIN A1C: Hgb A1c MFr Bld: 6 % (ref 4.6–6.5)

## 2021-09-18 NOTE — Assessment & Plan Note (Signed)
Stable on BBlocker.

## 2021-09-18 NOTE — Patient Instructions (Signed)
Please stop at the lab to have labs drawn.  

## 2021-09-18 NOTE — Progress Notes (Signed)
Patient ID: Martha Hayes, female    DOB: Nov 21, 1950, 70 y.o.   MRN: [id number]  This visit was conducted in person.  BP (!) 130/58   Pulse 64   Temp 97.9 F (36.6 C) (Temporal)   Ht 5' 1.5" (1.562 m)   Wt 123 lb 2 oz (55.8 kg)   PF 97 L/min   BMI 22.89 kg/m    CC:  Chief Complaint  Patient presents with   Medicare Wellness    Subjective:   HPI: Martha Hayes is a 70 y.o. female presenting on 09/18/2021 for Medicare Wellness  The patient presents for annual medicare wellness, complete physical and review of chronic health problems. He/She also has the following acute concerns today:  I have personally reviewed the Medicare Annual Wellness questionnaire and have noted 1. The patient's medical and social history 2. Their use of alcohol, tobacco or illicit drugs 3. Their current medications and supplements 4. The patient's functional ability including ADL's, fall risks, home safety risks and hearing or visual             impairment. 5. Diet and physical activities 6. Evidence for depression or mood disorders 7.         Updated provider list Cognitive evaluation was performed and recorded on pt medicare questionnaire form. The patients weight, height, BMI and visual acuity have been recorded in the chart   I have made referrals, counseling and provided education to the patient based review of the above and I have provided the pt with a written personalized care plan for preventive services.   Documentation of this information was scanned into the electronic record under the media tab.   Advance directives and end of life planning reviewed in detail with patient and documented in EMR. Patient given handout on advance care directives if needed. HCPOA and living will updated if needed.  Hearing Screening  Method: Audiometry   500Hz  1000Hz  2000Hz  4000Hz   Right ear 20 20 20 20   Left ear 20 20 20 20   Comments: Yearly Eye Exam with Dr. Wallace Going 12/2020   Fall Risk 09/01/2018  09/10/2018 09/03/2019 09/08/2020 09/18/2021  Falls in the past year? 0 0 0 1 1  Was there an injury with Fall? - - - 0 0  Fall Risk Category Calculator - - - 2 1  Fall Risk Category - - - Moderate Low  Patient Fall Risk Level - - Low fall risk - -   Flowsheet Row Office Visit from 09/18/2021 in [place] at [facility] Total Score 0        PSVT: stable coreg BID  No episodes.. Dr. Saunders Revel released her to PCP care.   Hypertension:   At goal on  coreg, lisinopril and HCTZ BP Readings from Last 3 Encounters:  09/18/21 (!) 130/58  06/20/21 120/60  04/11/21 120/62  Using medication without problems or lightheadedness: none Chest pain with exertion: none  Edema: none Short of breath:none Average home BPs: Other issues:  Elevated Cholesterol:  On simvastatin 20 mg daily Due for re-eval. Using medications without problems: none Muscle aches:  none Diet compliance: healthy Exercise: walking 4 miles 5 days Other complaints: Prediabetes       Relevant past medical, surgical, family and social history reviewed and updated as indicated. Interim medical history since our last visit reviewed. Allergies and medications reviewed and updated. Outpatient Medications Prior to Visit  Medication Sig Dispense Refill   carvedilol (COREG) 6.25 MG tablet Take 1 tablet (  6.25 mg total) by mouth 2 (two) times daily with a meal. 180 tablet 1   hydrochlorothiazide (MICROZIDE) 12.5 MG capsule TAKE 1 CAPSULE BY MOUTH EVERY DAY 90 capsule 3   lisinopril (ZESTRIL) 10 MG tablet TAKE 1 TABLET BY MOUTH EVERY DAY 90 tablet 3   simvastatin (ZOCOR) 20 MG tablet TAKE 1 TABLET BY MOUTH EVERY DAY IN THE EVENING 90 tablet 3   No facility-administered medications prior to visit.     Per HPI unless specifically indicated in ROS section below Review of Systems  Constitutional:  Negative for fatigue and fever.  HENT:  Negative for congestion.   Eyes:  Negative for pain.  Respiratory:  Negative  for cough and shortness of breath.   Cardiovascular:  Negative for chest pain, palpitations and leg swelling.  Gastrointestinal:  Negative for abdominal pain.  Genitourinary:  Negative for dysuria and vaginal bleeding.  Musculoskeletal:  Negative for back pain.  Neurological:  Negative for syncope, light-headedness and headaches.  Psychiatric/Behavioral:  Negative for dysphoric mood.   Objective:  BP (!) 130/58   Pulse 64   Temp 97.9 F (36.6 C) (Temporal)   Ht 5' 1.5" (1.562 m)   Wt 123 lb 2 oz (55.8 kg)   PF 97 L/min   BMI 22.89 kg/m   Wt Readings from Last 3 Encounters:  09/18/21 123 lb 2 oz (55.8 kg)  06/20/21 126 lb 4 oz (57.3 kg)  04/11/21 124 lb 8 oz (56.5 kg)      Physical Exam Vitals and nursing note reviewed.  Constitutional:      General: She is not in acute distress.    Appearance: Normal appearance. She is well-developed. She is not ill-appearing or toxic-appearing.  HENT:     Head: Normocephalic.     Right Ear: Hearing, tympanic membrane, ear canal and external ear normal.     Left Ear: Hearing, tympanic membrane, ear canal and external ear normal.     Nose: Nose normal.  Eyes:     General: Lids are normal. Lids are everted, no foreign bodies appreciated.     Conjunctiva/sclera: Conjunctivae normal.     Pupils: Pupils are equal, round, and reactive to light.  Neck:     Thyroid: No thyroid mass or thyromegaly.     Vascular: No carotid bruit.     Trachea: Trachea normal.  Cardiovascular:     Rate and Rhythm: Normal rate and regular rhythm.     Heart sounds: Normal heart sounds, S1 normal and S2 normal. No murmur heard.   No gallop.  Pulmonary:     Effort: Pulmonary effort is normal. No respiratory distress.     Breath sounds: Normal breath sounds. No wheezing, rhonchi or rales.  Abdominal:     General: Bowel sounds are normal. There is no distension or abdominal bruit.     Palpations: Abdomen is soft. There is no fluid wave or mass.     Tenderness: There  is no abdominal tenderness. There is no guarding or rebound.     Hernia: No hernia is present.  Genitourinary:    Exam position: Supine.     Labia:        Right: No rash, tenderness or lesion.        Left: No rash, tenderness or lesion.      Vagina: Normal.     Cervix: No cervical motion tenderness, discharge or friability.     Uterus: Not enlarged and not tender.      Adnexa:  Right: No mass, tenderness or fullness.         Left: No mass, tenderness or fullness.    Musculoskeletal:     Cervical back: Normal range of motion and neck supple.  Lymphadenopathy:     Cervical: No cervical adenopathy.  Skin:    General: Skin is warm and dry.     Findings: No rash.  Neurological:     Mental Status: She is alert.     Cranial Nerves: No cranial nerve deficit.     Sensory: No sensory deficit.  Psychiatric:        Mood and Affect: Mood is not anxious or depressed.        Speech: Speech normal.        Behavior: Behavior normal. Behavior is cooperative.        Judgment: Judgment normal.      Results for orders placed or performed in visit on 09/05/20  Comprehensive metabolic panel  Result Value Ref Range   Sodium 136 135 - 145 mEq/L   Potassium 4.3 3.5 - 5.1 mEq/L   Chloride 101 96 - 112 mEq/L   CO2 29 19 - 32 mEq/L   Glucose, Bld 93 70 - 99 mg/dL   BUN 16 6 - 23 mg/dL   Creatinine, Ser 0.80 0.40 - 1.20 mg/dL   Total Bilirubin 0.5 0.2 - 1.2 mg/dL   Alkaline Phosphatase 85 39 - 117 U/L   AST 21 0 - 37 U/L   ALT 25 0 - 35 U/L   Total Protein 7.0 6.0 - 8.3 g/dL   Albumin 4.3 3.5 - 5.2 g/dL   GFR 75.04 >60.00 mL/min   Calcium 9.3 8.4 - 10.5 mg/dL  Lipid panel  Result Value Ref Range   Cholesterol 167 0 - 200 mg/dL   Triglycerides 146.0 0.0 - 149.0 mg/dL   HDL 50.10 >39.00 mg/dL   VLDL 29.2 0.0 - 40.0 mg/dL   LDL Cholesterol 88 0 - 99 mg/dL   Total CHOL/HDL Ratio 3    NonHDL 117.30   Hemoglobin A1c  Result Value Ref Range   Hgb A1c MFr Bld 6.1 4.6 - 6.5 %    This  visit occurred during the SARS-CoV-2 public health emergency.  Safety protocols were in place, including screening questions prior to the visit, additional usage of staff PPE, and extensive cleaning of exam room while observing appropriate contact time as indicated for disinfecting solutions.   COVID 19 screen:  No recent travel or known exposure to COVID19 The patient denies respiratory symptoms of COVID 19 at this time. The importance of social distancing was discussed today.   Assessment and Plan   The patient's preventative maintenance and recommended screening tests for an annual wellness exam were reviewed in full today. Brought up to date unless services declined.  Counselled on the importance of diet, exercise, and its role in overall health and mortality. The patient's FH and SH was reviewed, including their home life, tobacco status, and drug and alcohol status.   Vaccines: Uptodate zoster, flu, PCV, COVID x 3, shingrix. Consider Td . PAP/DVE: partial hysterectomy,  No family history of ovarian cancer,DVE not indicated. Mammo: stable 05/2021, pt wishes to do MBE yearly. Colonoscopy: Dr. Watt Climes Date:  02/2018  Results: Diverticulosis , hemorrhoids, repeat in 10 years DEXA: 2010.. Osteopenia.Marland Kitchen Has not been taking ca and vit D. Does not EVER want to do DEXA, would not use meds to treat.   Vit D in nml range. HEP C screen:  done HIV screen: refused nonsmoker  Problem List Items Addressed This Visit     Essential hypertension (Chronic)    Stable, chronic.  Continue current medication.   Lisinopril 10 mg daily  Coreg 6.25 mg  BID HCTZ 25 mg daily      Prediabetes (Chronic)   PSVT (paroxysmal supraventricular tachycardia) (HCC) (Chronic)     Stable on BBlocker.      Pure hypercholesterolemia (Chronic)    Due for re-eval on simvastatin 20 mg daily      Other Visit Diagnoses     Medicare annual wellness visit, subsequent    -  Primary       Eliezer Lofts, MD

## 2021-09-18 NOTE — Assessment & Plan Note (Signed)
Stable, chronic.  Continue current medication.  Lisinopril 10 mg daily  Coreg 6.25 mg  BID HCTZ 25 mg daily 

## 2021-09-18 NOTE — Assessment & Plan Note (Addendum)
Due for re-eval on simvastatin 20 mg daily

## 2021-09-26 ENCOUNTER — Encounter: Payer: Medicare HMO | Admitting: Dermatology

## 2021-10-11 ENCOUNTER — Other Ambulatory Visit: Payer: Self-pay | Admitting: Family Medicine

## 2021-11-13 IMAGING — MG DIGITAL SCREENING BILAT W/ TOMO W/ CAD
8 series · 8 of 24 positions shown · non-contrast
Comparison: Previous exam(s).

CLINICAL DATA: Screening.

EXAM:
DIGITAL SCREENING BILATERAL MAMMOGRAM WITH TOMO AND CAD

[L CC synth-2D]
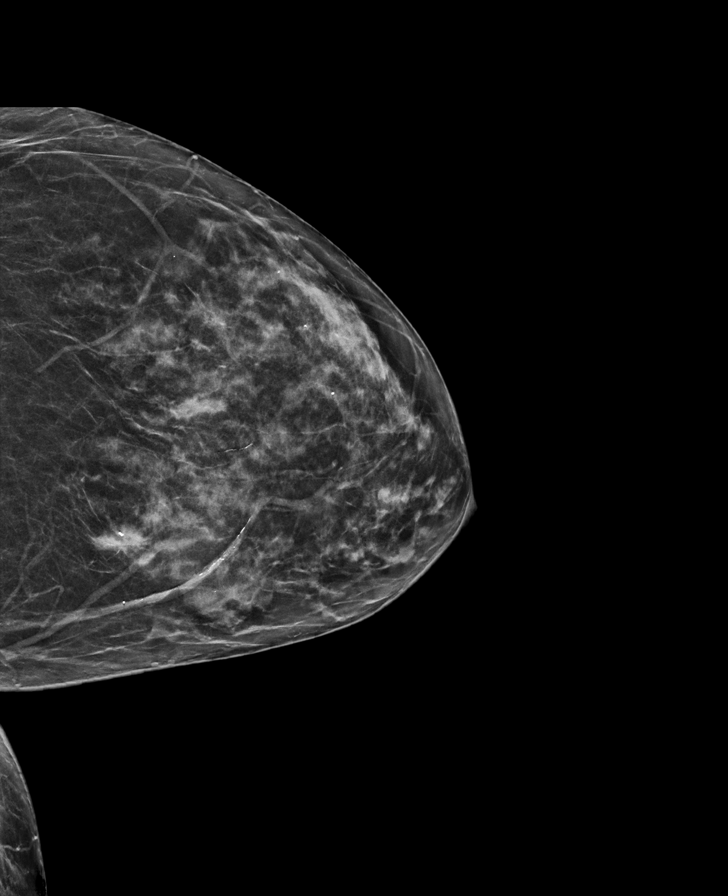

[L MLO synth-2D]
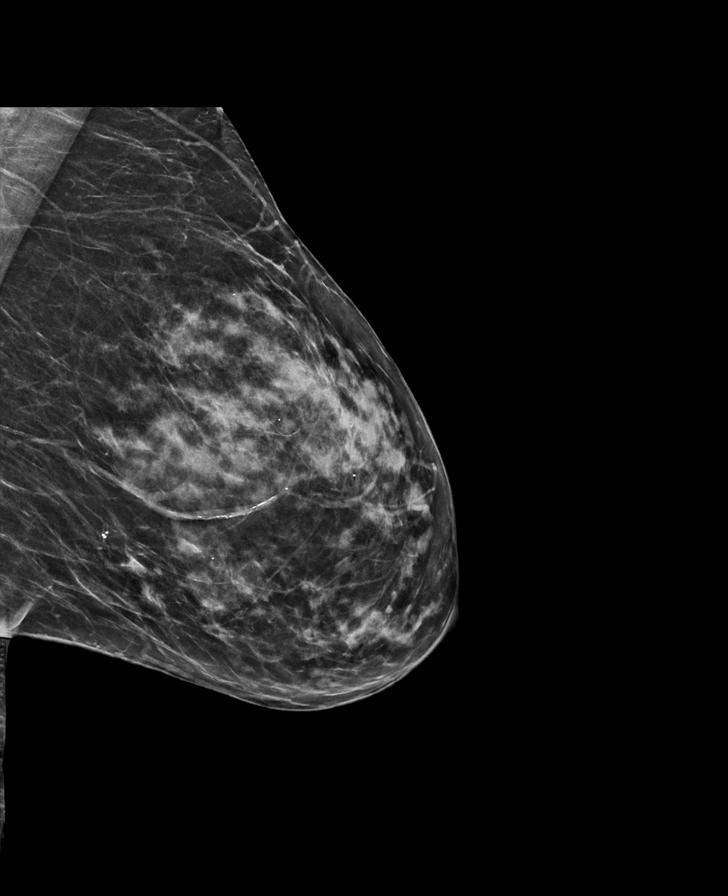

[R CC synth-2D]
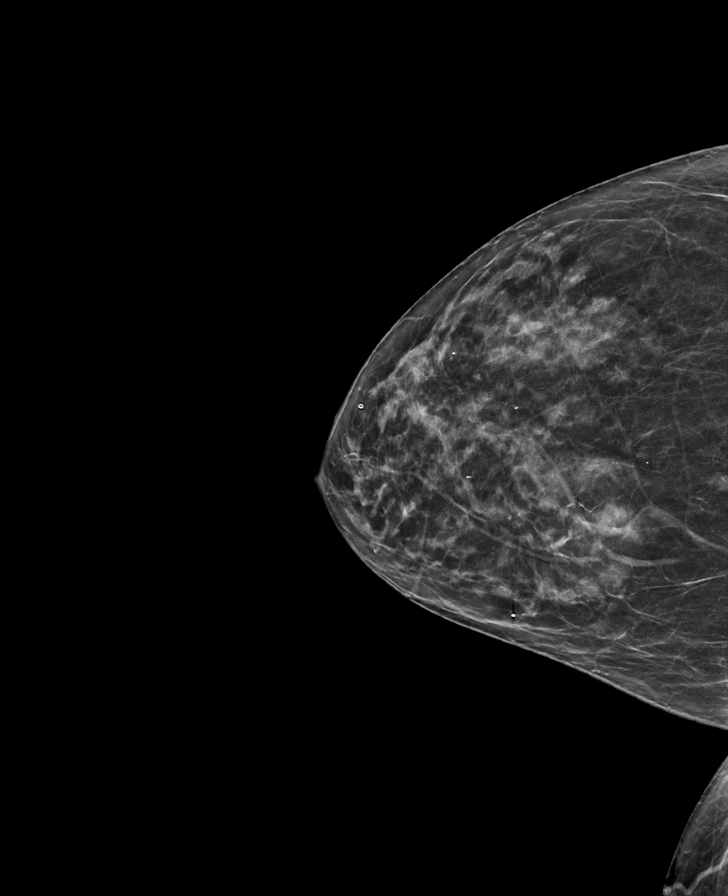

[R MLO synth-2D]
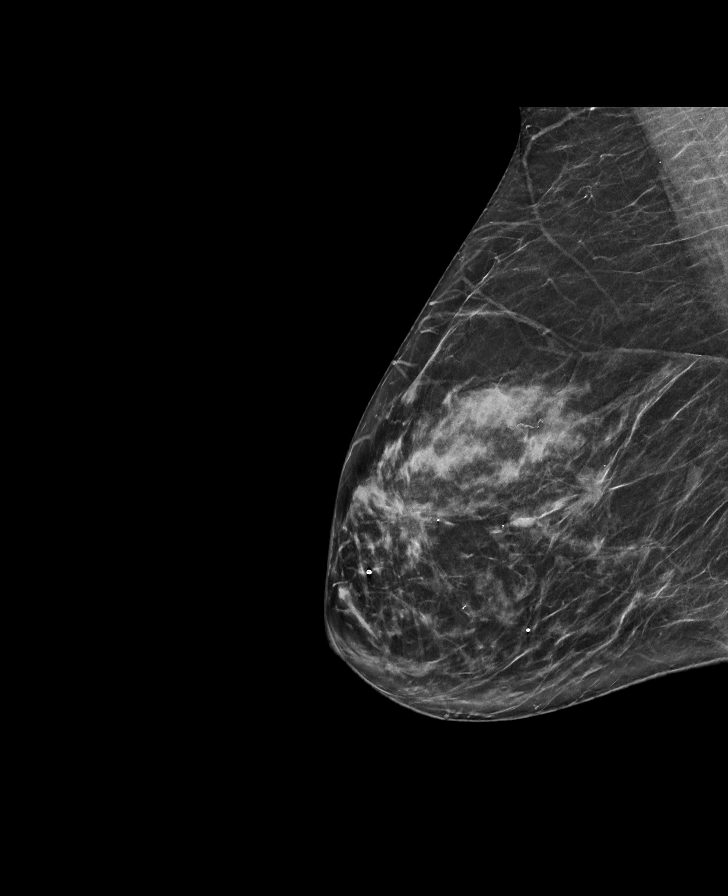

[L CC tomo · tomo slice 29/58.0]
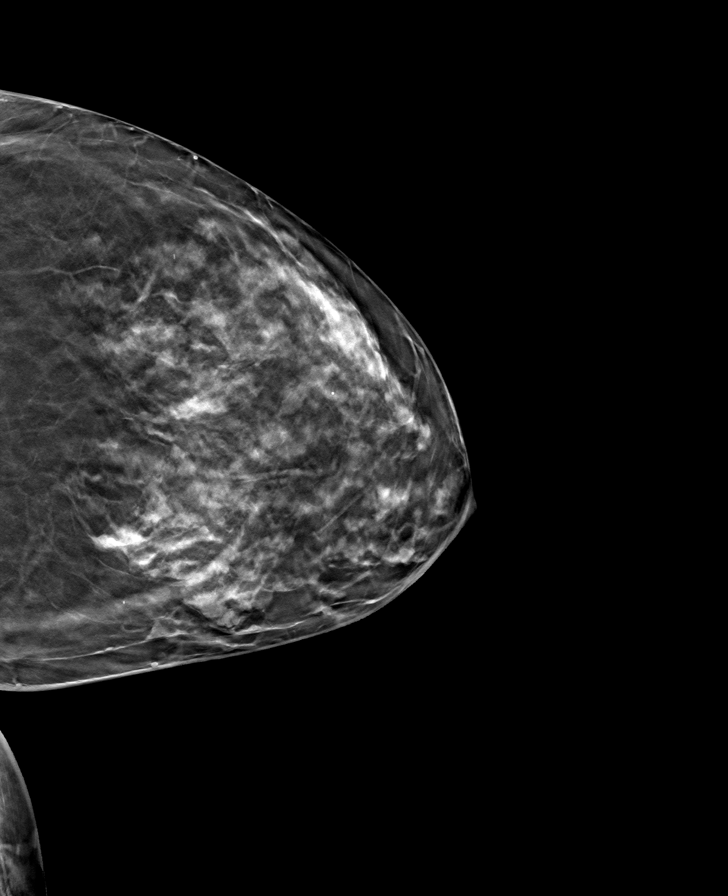

[L MLO tomo · tomo slice 30/59.0]
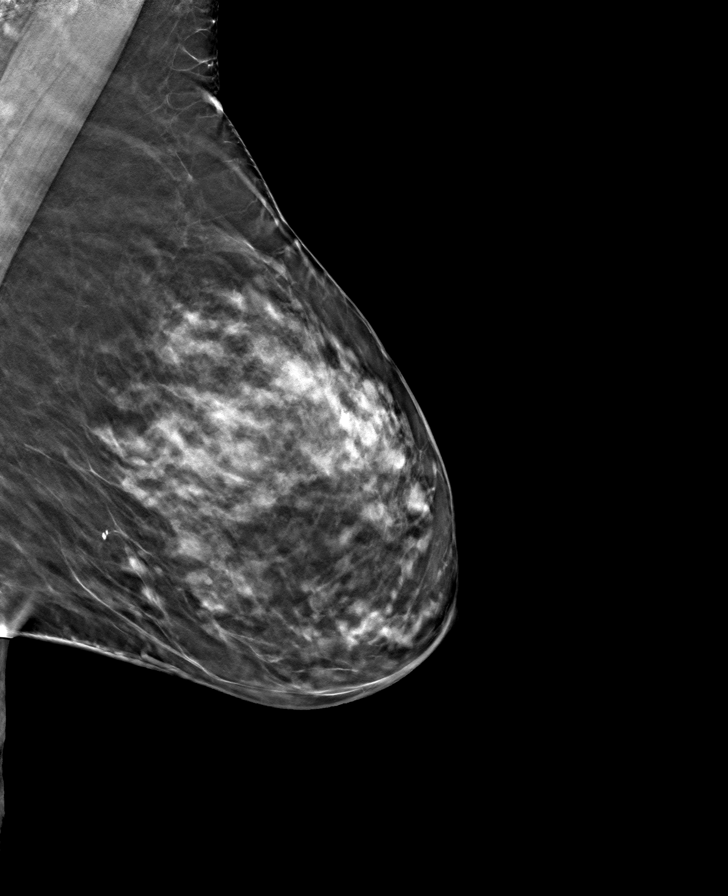

[R MLO tomo · tomo slice 31/60.0]
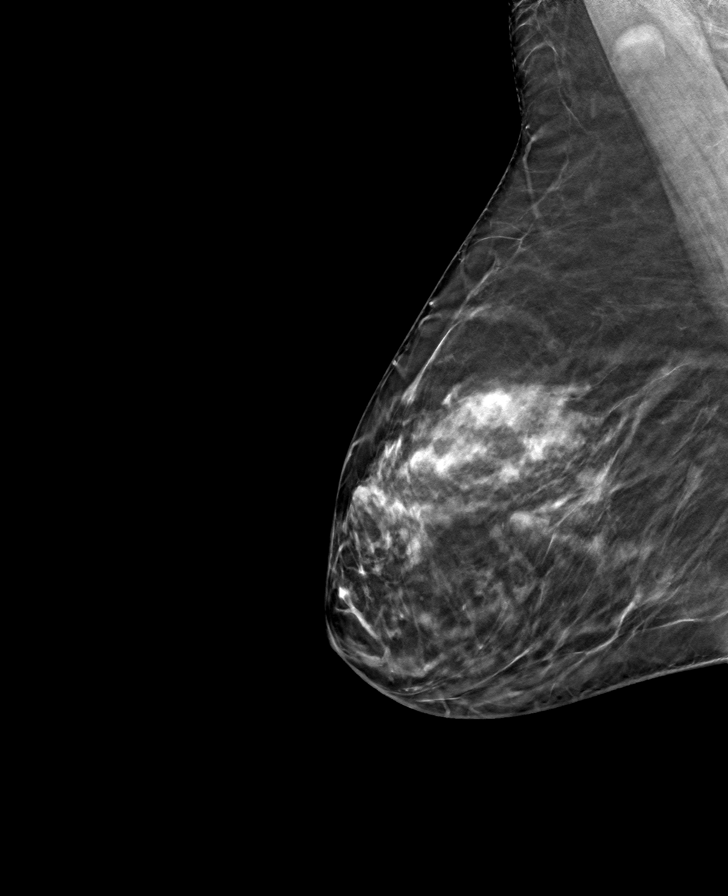

[R CC tomo · tomo slice 29/56.0]
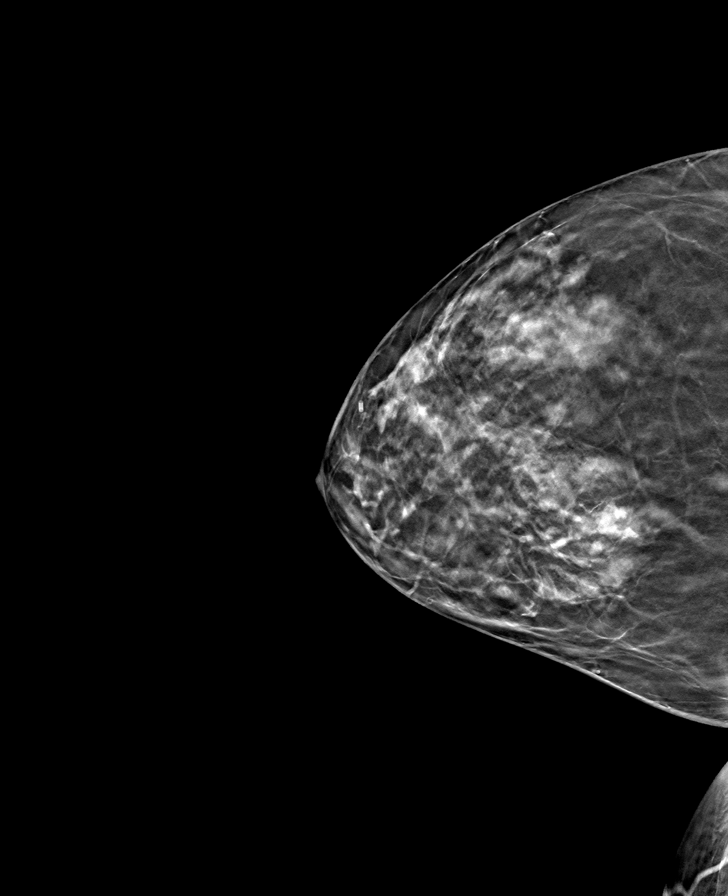

[8 of 24 positions shown; findings below may reference images not displayed]

ACR Breast Density Category c: The breast tissue is heterogeneously
dense, which may obscure small masses.
FINDINGS: There are no findings suspicious for malignancy. Images were
processed with CAD.
IMPRESSION: No mammographic evidence of malignancy. A result letter of this
screening mammogram will be mailed directly to the patient.

RECOMMENDATION:
Screening mammogram in one year. (Code:[id number])

BI-RADS CATEGORY  1: Negative.

## 2021-11-15 ENCOUNTER — Other Ambulatory Visit: Payer: Self-pay | Admitting: Internal Medicine

## 2021-11-20 ENCOUNTER — Other Ambulatory Visit: Payer: Self-pay | Admitting: *Deleted

## 2021-11-20 MED ORDER — CARVEDILOL 6.25 MG PO TABS: 6.2500 mg | ORAL_TABLET | Freq: Two times a day (BID) | ORAL | 3 refills | Status: DC

## 2021-12-06 DIAGNOSIS — H43812 Vitreous degeneration, left eye: Secondary | ICD-10-CM | POA: Diagnosis not present

## 2021-12-17 ENCOUNTER — Ambulatory Visit: Payer: Medicare HMO | Admitting: Family Medicine

## 2022-01-09 ENCOUNTER — Ambulatory Visit (INDEPENDENT_AMBULATORY_CARE_PROVIDER_SITE_OTHER): Payer: Medicare HMO | Admitting: Family Medicine

## 2022-01-09 ENCOUNTER — Other Ambulatory Visit: Payer: Self-pay

## 2022-01-09 ENCOUNTER — Encounter: Payer: Self-pay | Admitting: Family Medicine

## 2022-01-09 VITALS — BP 140/66 | HR 71 | Temp 98.2°F | Ht 61.5 in | Wt 126.2 lb

## 2022-01-09 DIAGNOSIS — R29898 Other symptoms and signs involving the musculoskeletal system: Secondary | ICD-10-CM

## 2022-01-09 DIAGNOSIS — M7061 Trochanteric bursitis, right hip: Secondary | ICD-10-CM

## 2022-01-09 DIAGNOSIS — M25551 Pain in right hip: Secondary | ICD-10-CM | POA: Diagnosis not present

## 2022-01-09 MED ORDER — TRIAMCINOLONE ACETONIDE 40 MG/ML IJ SUSP
40.0000 mg | Freq: Once | INTRAMUSCULAR | Status: AC
  Administered 2022-01-09: 40 mg via INTRA_ARTICULAR

## 2022-01-09 NOTE — Progress Notes (Signed)
? ? ?Martha Prout T. Martha Rawlins, MD, [facility] ?Therapist, music at [facility] ?[place] ?[place], [address] ? ?Phone: [phone]  FAX: [phone] ? ?Martha Hayes - 71 y.o. female  MRN [id number]  Date of Birth: 06-16-1951 ? ?Date: 01/09/2022  PCP: Jinny Sanders, MD  Referral: Jinny Sanders, MD ? ?Chief Complaint  ?Patient presents with  ? Hip Pain  ?  Right  ? ? ?This visit occurred during the SARS-CoV-2 public health emergency.  Safety protocols were in place, including screening questions prior to the visit, additional usage of staff PPE, and extensive cleaning of exam room while observing appropriate contact time as indicated for disinfecting solutions.  ? ?Subjective:  ? ?Martha Hayes is a 71 y.o. very pleasant female patient with Body mass index is 23.47 kg/m?. who presents with the following: ? ?Patient is seen as a new consultation courtesy of Dr. Diona Browner for evaluation of right-sided hip pain. ? ?Lateral hip pain, she will have some after walking.  She is normally extremely active, she walks about 4 to 4-1/2 miles a day with her friends.  She slipped on the sidewalk she thinks roughly 3 weeks before then, she was completely asymptomatic until about 6 weeks ago. ? ?She denies any groin pain or back pain.  No radicular pain, numbness, tingling.  No prior significant hip problems, trauma, surgery. ? ?Review of Systems is noted in the HPI, as appropriate ? ?Objective:  ? ?BP 140/66   Pulse 71   Temp 98.2 ?F (36.8 ?C) (Oral)   Ht 5' 1.5" (1.562 m)   Wt 126 lb 4 oz (57.3 kg)   SpO2 99%   BMI 23.47 kg/m?  ? ?GEN: No acute distress; alert,appropriate. ?PULM: Breathing comfortably in no respiratory distress ?PSYCH: Normally interactive.  ? ? ?HIP EXAM: SIDE: Right ?ROM: Abduction, Flexion, Internal and External range of motion: Full ?Pain with terminal IROM and EROM: None ?GTB: Focal tenderness ?SLR: NEG ?Knees: No effusion ?FABER: NT ?REVERSE FABER: NT, neg ?Piriformis: NT at  direct palpation ?Str: flexion: 4-/5 ?abduction: 3+/5 ?adduction: 4-/5 ?Strength testing non-tender ?  ?She does also walk with a Trendelenburg gait. ? ?Laboratory and Imaging Data: ? ?Assessment and Plan:  ? ?  ICD-10-CM   ?1. Trochanteric bursitis of right hip  M70.61   ?  ?2. Right hip pain  M25.551 triamcinolone acetonide (KENALOG-40) injection 40 mg  ?  ?3. Weakness of right hip  R29.898   ?  ? ?Focal trochanteric bursitis with associated weakness of the hip.  This is almost certainly secondary to pain. ?Intra-articular pathology is not suspected. ? ?A rehabilitation program from the [facility] was reviewed with the patient face to face for their condition for hip strengthening. ? ?I am going to do a therapeutic injection today of the trochanteric bursa.  I think it is also a key importance for her to get her strength back to normal.  She is incredibly active and strong for age, so I would anticipate that she would do well. ? ?Social: This is significantly limiting her ability to exercise at her desired level and having some pain. ? ?Aspiration/Injection Procedure Note ?Martha Hayes ?[birth year] ?Date of procedure: 01/09/2022 ? ?Procedure: Large Joint Aspiration / Injection of Hip, Trochanteric Bursa, R ?Indications: Pain ? ?Procedure Details ?Verbal consent obtained. Risks, benefits, and alternatives reviewed. Greater trochanter sterilely prepped with Chloraprep. Ethyl Chloride used for anesthesia. 9 cc of Lidocaine 1% injected with 1 mL of Kenalog  40 mg into trochanteric bursa at area of maximal tenderness at greater trochanter. Needle taken to bone to troch bursa, flows easily. Bursa massaged. No bleeding and no complications. Decreased pain after injection. ?Needle: 22 gauge spinal needle ?Medication: 1 mL of Kenalog 40 mg  ? ?Meds ordered this encounter  ?Medications  ? triamcinolone acetonide (KENALOG-40) injection 40 mg  ? ?There are no discontinued medications. ?No orders of the  defined types were placed in this encounter. ? ? ?Follow-up: No follow-ups on file. ? ?Dragon [employer] One speech-to-text software was used for transcription in this dictation.  Possible transcriptional errors can occur using Editor, commissioning.  ? ?Signed, ? ?Kady Toothaker T. Shanera Meske, MD ? ? ?Outpatient Encounter Medications as of 01/09/2022  ?Medication Sig  ? carvedilol (COREG) 6.25 MG tablet Take 1 tablet (6.25 mg total) by mouth 2 (two) times daily with a meal.  ? Cholecalciferol (VITAMIN D3) 25 MCG (1000 UT) CAPS Take 1 capsule by mouth daily at 2 PM.  ? hydrochlorothiazide (MICROZIDE) 12.5 MG capsule TAKE 1 CAPSULE BY MOUTH EVERY DAY  ? lisinopril (ZESTRIL) 10 MG tablet TAKE 1 TABLET BY MOUTH EVERY DAY  ? simvastatin (ZOCOR) 20 MG tablet TAKE 1 TABLET BY MOUTH EVERY DAY IN THE EVENING  ? [EXPIRED] triamcinolone acetonide (KENALOG-40) injection 40 mg   ? ?No facility-administered encounter medications on file as of 01/09/2022.  ?  ?

## 2022-03-28 ENCOUNTER — Other Ambulatory Visit: Payer: Self-pay | Admitting: Family Medicine

## 2022-03-28 DIAGNOSIS — Z1231 Encounter for screening mammogram for malignant neoplasm of breast: Secondary | ICD-10-CM

## 2022-06-03 ENCOUNTER — Ambulatory Visit
Admission: RE | Admit: 2022-06-03 | Discharge: 2022-06-03 | Disposition: A | Discharge status: Home / Self Care | Payer: Medicare HMO | Source: Ambulatory Visit | Attending: Family Medicine | Admitting: Family Medicine

## 2022-06-03 DIAGNOSIS — Z1231 Encounter for screening mammogram for malignant neoplasm of breast: Secondary | ICD-10-CM | POA: Diagnosis not present

## 2022-06-05 ENCOUNTER — Encounter: Payer: Self-pay | Admitting: Family Medicine

## 2022-06-05 ENCOUNTER — Ambulatory Visit (INDEPENDENT_AMBULATORY_CARE_PROVIDER_SITE_OTHER): Payer: Medicare HMO | Admitting: Family Medicine

## 2022-06-05 VITALS — BP 122/70 | HR 62 | Temp 97.9°F | Ht 61.5 in | Wt 126.2 lb

## 2022-06-05 DIAGNOSIS — M7061 Trochanteric bursitis, right hip: Secondary | ICD-10-CM | POA: Diagnosis not present

## 2022-06-05 DIAGNOSIS — M67951 Unspecified disorder of synovium and tendon, right thigh: Secondary | ICD-10-CM | POA: Diagnosis not present

## 2022-06-05 NOTE — Progress Notes (Unsigned)
    Martha T. Copland, MD, [facility] at [facility] [address] [place], [address]  Phone: [phone]  FAX: [phone]  Martha Hayes - 71 y.o. female  MRN [id number]  Date of Birth: 04/16/1951  Date: 06/05/2022  PCP: Excell Seltzer, MD  Referral: Excell Seltzer, MD  Chief Complaint  Patient presents with   Hip Pain    right   Subjective:   Martha Hayes is a 71 y.o. very pleasant female patient with Body mass index is 23.47 kg/m. who presents with the following:  Lateral hip on the R side.   Glute medius tendinopathy, r   Weak hips   Review of Systems is noted in the HPI, as appropriate  Objective:   BP 122/70 (BP Location: Left Arm, Patient Position: Sitting)   Pulse 62   Temp 97.9 F (36.6 C) (Oral)   Ht 5' 1.5" (1.562 m)   Wt 126 lb 4 oz (57.3 kg)   SpO2 99%   BMI 23.47 kg/m   GEN: No acute distress; alert,appropriate. PULM: Breathing comfortably in no respiratory distress PSYCH: Normally interactive.   Laboratory and Imaging Data:  Assessment and Plan:   ***

## 2022-06-06 ENCOUNTER — Encounter: Payer: Self-pay | Admitting: Family Medicine

## 2022-06-06 NOTE — Progress Notes (Signed)
Shekita Boyden T. Lakresha Stifter, MD, [place] at [facility] [place], [address]  Phone: [phone]  FAX: [phone]  Martha Hayes - 71 y.o. female  MRN [id number]  Date of Birth: 05-09-1951  Date: 06/05/2022  PCP: Jinny Sanders, MD  Referral: Jinny Sanders, MD  Chief Complaint  Patient presents with   Hip Pain    right   Subjective:   Martha Hayes is a 71 y.o. very pleasant female patient with Body mass index is 23.47 kg/m. who presents with the following:  The patient is here in follow-up regarding her right-sided lateral hip pain.  I saw her in March of this year, and at that point I felt like she had predominantly some trochanteric bursitis, and I did do a greater trochanter injection at that time.  While she did improve then, she now continues to have some persistent right-sided lateral hip pain.  She is a very active lady at baseline, she walks at a bare minimum 3 miles almost every day of the week.  She has a deep dull ache laterally, more proximal compared to the trochanteric bursa.  She denies any back pain, she denies any current groin pain.  At her last office visit, did give her home rehab program that she did 2 or 3 times.  At the same time, she is also had her right knee bother her in the interval time.  At baseline she is quite healthy, and she only takes blood pressure and cholesterol medication.  Review of Systems is noted in the HPI, as appropriate  Objective:   BP 122/70 (BP Location: Left Arm, Patient Position: Sitting)   Pulse 62   Temp 97.9 F (36.6 C) (Oral)   Ht 5' 1.5" (1.562 m)   Wt 126 lb 4 oz (57.3 kg)   SpO2 99%   BMI 23.47 kg/m   GEN: No acute distress; alert,appropriate. PULM: Breathing comfortably in no respiratory distress PSYCH: Normally interactive.   No tenderness throughout the lumbar spine.  No significant tenderness along the posterior pelvic rim. No tenderness at the SI  joints.  HIP EXAM: SIDE: R ROM: Abduction, Flexion, Internal and External range of motion: Full Pain with terminal IROM and EROM: Minimal to none GTB: Mildly tender to palpation Point of maximal tenderness is caudal to the trochanteric bursa, in the region of the gluteus medius insertion of the femur. SLR: NEG Knees: No effusion FABER: NT REVERSE FABER: NT, neg Piriformis: NT at direct palpation Str: flexion: 3+ to 4-/5 abduction: 4/5 adduction: 4/5 Strength testing does provoke some mild pain. At the contralateral side her abduction and abduction is 5/5, and hip flexion is 4+/5    Laboratory and Imaging Data:  Assessment and Plan:     ICD-10-CM   1. Tendinopathy of right gluteus medius  M67.951     2. Trochanteric bursitis of right hip  M70.61      Consistent with gluteus medius +/- gluteus minimus tendinopathy with some continued involvement to a lesser extent at the trochanteric bursa.  Her hips are weak, notably on the right side, and I suspect that if she corrected for some of the strength deficiencies that her hip pain as well as her knee pain would improve.  At this point, I do not think that injecting her hip would really be of critical import, but if symptoms persist then we can always do a gluteus medius insertional injection.  We reviewed some hip  strengthening, and I did give her a full [facility] rehab program, but I would like for her to concentrate on her strength.  Follow-up if needed: As needed if hip persist.  Dr[employer] One speech-to-text software was used for transcription in this dictation.  Possible transcriptional errors can occur using Editor, commissioning.   Signed,  Maud Deed. Britney Newstrom, MD   Outpatient Encounter Medications as of 06/05/2022  Medication Sig   carvedilol (COREG) 6.25 MG tablet Take 1 tablet (6.25 mg total) by mouth 2 (two) times daily with a meal.   Cholecalciferol (VITAMIN D3) 25 MCG (1000 UT) CAPS Take 1 capsule by mouth daily at 2 PM.    hydrochlorothiazide (MICROZIDE) 12.5 MG capsule TAKE 1 CAPSULE BY MOUTH EVERY DAY   lisinopril (ZESTRIL) 10 MG tablet TAKE 1 TABLET BY MOUTH EVERY DAY   simvastatin (ZOCOR) 20 MG tablet TAKE 1 TABLET BY MOUTH EVERY DAY IN THE EVENING   No facility-administered encounter medications on file as of 06/05/2022.

## 2022-07-11 ENCOUNTER — Ambulatory Visit: Payer: Medicare HMO | Admitting: Dermatology

## 2022-07-11 DIAGNOSIS — Z85828 Personal history of other malignant neoplasm of skin: Secondary | ICD-10-CM | POA: Diagnosis not present

## 2022-07-11 DIAGNOSIS — L821 Other seborrheic keratosis: Secondary | ICD-10-CM | POA: Diagnosis not present

## 2022-07-11 DIAGNOSIS — L570 Actinic keratosis: Secondary | ICD-10-CM | POA: Diagnosis not present

## 2022-07-11 DIAGNOSIS — D229 Melanocytic nevi, unspecified: Secondary | ICD-10-CM

## 2022-07-11 DIAGNOSIS — L814 Other melanin hyperpigmentation: Secondary | ICD-10-CM

## 2022-07-11 DIAGNOSIS — D1801 Hemangioma of skin and subcutaneous tissue: Secondary | ICD-10-CM

## 2022-07-11 DIAGNOSIS — Z1283 Encounter for screening for malignant neoplasm of skin: Secondary | ICD-10-CM

## 2022-07-11 MED ORDER — FLUOROURACIL 5 % EX CREA
TOPICAL_CREAM | Freq: Two times a day (BID) | CUTANEOUS | 0 refills | Status: DC
Start: 2022-07-11 — End: 2023-08-14

## 2022-07-11 NOTE — Progress Notes (Signed)
   Follow-Up Visit   Subjective  Martha Hayes is a 71 y.o. female who presents for the following: TBSE (The patient presents for Total-Body Skin Exam (TBSE) for skin cancer screening and mole check.  The patient has spots, moles and lesions to be evaluated, some may be new or changing and the patient has concerns that these could be cancer. Patient with hx of SCC.).  Patient has noticed a scaly spot at left temple over the last 4 months.   The following portions of the chart were reviewed this encounter and updated as appropriate:   Tobacco  Allergies  Meds  Problems  Med Hx  Surg Hx  Fam Hx      Review of Systems:  No other skin or systemic complaints except as noted in HPI or Assessment and Plan.  Objective  Well appearing patient in no apparent distress; mood and affect are within normal limits.  A full examination was performed including scalp, head, eyes, ears, nose, lips, neck, chest, axillae, abdomen, back, buttocks, bilateral upper extremities, bilateral lower extremities, hands, feet, fingers, toes, fingernails, and toenails. All findings within normal limits unless otherwise noted below.  left temple, left cheek, right cheek, nose Erythematous thin papules/macules with gritty scale.     Assessment & Plan   Lentigines - Scattered tan macules - Due to sun exposure - Benign-appearing, observe - Recommend daily broad spectrum sunscreen SPF 30+ to sun-exposed areas, reapply every 2 hours as needed. - Call for any changes  Seborrheic Keratoses - Stuck-on, waxy, tan-brown papules and/or plaques  - Benign-appearing - Discussed benign etiology and prognosis. - Observe - Call for any changes  Melanocytic Nevi - Tan-brown and/or pink-flesh-colored symmetric macules and papules - Benign appearing on exam today - Observation - Call clinic for new or changing moles - Recommend daily use of broad spectrum spf 30+ sunscreen to sun-exposed areas.   Hemangiomas - Red  papules - Discussed benign nature - Observe - Call for any changes  Actinic Damage - Chronic condition, secondary to cumulative UV/sun exposure - diffuse scaly erythematous macules with underlying dyspigmentation - Recommend daily broad spectrum sunscreen SPF 30+ to sun-exposed areas, reapply every 2 hours as needed.  - Staying in the shade or wearing long sleeves, sun glasses (UVA+UVB protection) and wide brim hats (4-inch brim around the entire circumference of the hat) are also recommended for sun protection.  - Call for new or changing lesions.  Skin cancer screening performed today.  History of Squamous Cell Carcinoma of the Skin - No evidence of recurrence today at left lateral zygoma adjacent to scar - No lymphadenopathy - Recommend regular full body skin exams - Recommend daily broad spectrum sunscreen SPF 30+ to sun-exposed areas, reapply every 2 hours as needed.  - Call if any new or changing lesions are noted between office visits   1 year recheck, sooner if any concerns  Graciella Belton, RMA, am acting as scribe for Forest Gleason, MD .  Documentation: I have reviewed the above documentation for accuracy and completeness, and I agree with the above.  Forest Gleason, MD

## 2022-07-11 NOTE — Patient Instructions (Addendum)
5-Fluorouracil Patient Education   Actinic keratoses are the dry, red scaly spots on the skin caused by sun damage. A portion of these spots can turn into skin cancer with time, and treating them can help prevent development of skin cancer.   Treatment of these spots requires removal of the defective skin cells. There are various ways to remove actinic keratoses, including freezing with liquid nitrogen, treatment with creams, or treatment with a blue light procedure in the office.   5-fluorouracil cream is a topical cream used to treat actinic keratoses. It works by interfering with the growth of abnormal fast-growing skin cells, such as actinic keratoses. These cells peel off and are replaced by healthy ones. THIS CREAM SHOULD BE KEPT OUT OF REACH OF CHILDREN AND PETS AND SHOULD NOT BE USED BY PREGNANT WOMEN.   INSTRUCTIONS FOR 5-FLUOROURACIL CREAM:   5-fluorouracil cream typically only needs to be used for 4-7 days. A thin layer should be applied twice a day to the treatment areas at left temple, left cheek, right cheek, nose recommended by your physician.   If your physician prescribed you separate tubes of 5-fluourouracil and calcipotriene, apply a thin layer of 5-fluorouracil followed by a thin layer of calcipotriene.   Avoid contact with your eyes, nostrils, and mouth. Do not use 5-fluorouracil/calcipotriene cream on infected or open wounds.   You will develop redness, irritation and some crusting at areas where you have pre-cancer damage/actinic keratoses. IF YOU DEVELOP PAIN, BLEEDING, OR SIGNIFICANT CRUSTING, STOP THE TREATMENT EARLY - you have already gotten a good response and the actinic keratoses should clear up well.  Wash your hands after applying 5-fluorouracil 5% cream on your skin.   A moisturizer or sunscreen with a minimum SPF 30 should be applied each morning.   Once you have finished the treatment, you can apply a thin layer of Vaseline twice a day to irritated areas to  soothe and calm the areas more quickly. If you experience significant discomfort, contact your physician.  For some patients it is necessary to repeat the treatment for best results.  SIDE EFFECTS: When using 5-fluorouracil cream, you may have mild irritation, such as redness, dryness, swelling, or a mild burning sensation. This usually resolves within 2 weeks. The more actinic keratoses you have, the more redness and inflammation you can expect during treatment. Eye irritation has been reported rarely. If this occurs, please let us know.   If you have any trouble using this cream, please call the office. If you have any other questions about this information, please do not hesitate to ask me before you leave the office.  Recommend taking Heliocare sun protection supplement daily in sunny weather for additional sun protection. For maximum protection on the sunniest days, you can take up to 2 capsules of regular Heliocare OR take 1 capsule of Heliocare Ultra. For prolonged exposure (such as a full day in the sun), you can repeat your dose of the supplement 4 hours after your first dose. Heliocare can be purchased at [employer], at some [facility] or at [email or web address].    Melanoma ABCDEs  Melanoma is the most dangerous type of skin cancer, and is the leading cause of death from skin disease.  You are more likely to develop melanoma if you: Have light-colored skin, light-colored eyes, or red or blond hair Spend a lot of time in the sun Tan regularly, either outdoors or in a tanning bed Have had blistering sunburns, especially during childhood Have a close family  member who has had a melanoma Have atypical moles or large birthmarks  Early detection of melanoma is key since treatment is typically straightforward and cure rates are extremely high if we catch it early.   The first sign of melanoma is often a change in a mole or a new dark spot.  The ABCDE system is a way of remembering the  signs of melanoma.  A for asymmetry:  The two halves do not match. B for border:  The edges of the growth are irregular. C for color:  A mixture of colors are present instead of an even brown color. D for diameter:  Melanomas are usually (but not always) greater than 62m - the size of a pencil eraser. E for evolution:  The spot keeps changing in size, shape, and color.  Please check your skin once per month between visits. You can use a small mirror in front and a large mirror behind you to keep an eye on the back side or your body.   If you see any new or changing lesions before your next follow-up, please call to schedule a visit.  Please continue daily skin protection including broad spectrum sunscreen SPF 30+ to sun-exposed areas, reapplying every 2 hours as needed when you're outdoors.    Due to recent changes in healthcare laws, you may see results of your pathology and/or laboratory studies on MyChart before the doctors have had a chance to review them. We understand that in some cases there may be results that are confusing or concerning to you. Please understand that not all results are received at the same time and often the doctors may need to interpret multiple results in order to provide you with the best plan of care or course of treatment. Therefore, we ask that you please give u[place]2 business days to thoroughly review all your results before contacting the office for clarification. Should we see a critical lab result, you will be contacted sooner.   If You Need Anything After Your Visit  If you have any questions or concerns for your doctor, please call our main line at [phone] press option 4 to reach your doctor's medical assistant. If no one answers, please leave a voicemail as directed and we will return your call as soon as possible. Messages left after 4 pm will be answered the following business day.   You may also send [place] message via [facility] We typically respond  to MyChart messages within 1-2 business days.  For prescription refills, please ask your pharmacy to contact our office. Our fax number is [phone]  If you have an urgent issue when the clinic is closed that cannot wait until the next business day, you can page your doctor at the number below.    Please note that while we do our best to be available for urgent issues outside of office hours, we are not available 24/7.   If you have an urgent issue and are unable to reach u[place] you may choose to seek medical care at your doctor's office, retail clinic, urgent care center, or emergency room.  If you have a medical emergency, please immediately call 911 or go to the emergency department.  Pager Numbers  - Dr. KNehemiah Massed [phone] - Dr. MLaurence Ferrari [phone] - Dr. SNicole Kindred [phone] In the event of inclement weather, please call our main line at [phone] an update on the status of any delays or closures.  Dermatology Medication Tips: Please  keep the boxes that topical medications come in in order to help keep track of the instructions about where and how to use these. Pharmacies typically print the medication instructions only on the boxes and not directly on the medication tubes.   If your medication is too expensive, please contact our office at [phone] option 4 or send [place] a message through [place].   We are unable to tell what your co-pay for medications will be in advance as this is different depending on your insurance coverage. However, we may be able to find a substitute medication at lower cost or fill out paperwork to get insurance to cover a needed medication.   If a prior authorization is required to get your medication covered by your insurance company, please allow [place] 1-2 business days to complete this process.  Drug prices often vary depending on where the prescription is filled and some pharmacies may offer cheaper prices.  The website www.goodrx.com  contains coupons for medications through different pharmacies. The prices here do not account for what the cost may be with help from insurance (it may be cheaper with your insurance), but the website can give you the price if you did not use any insurance.  - You can print the associated coupon and take it with your prescription to the pharmacy.  - You may also stop by our office during regular business hours and pick up a GoodRx coupon card.  - If you need your prescription sent electronically to a different pharmacy, notify our office through [facility] or by phone at [phone] option 4.     Si Usted Necesita Algo Despus de Su Visita  Tambin puede enviarnos un mensaje a travs de Pharmacist, community. Por lo general respondemos a los mensajes de MyChart en el transcurso de 1 a 2 das hbiles.  Para renovar recetas, por favor pida a su farmacia que se ponga en contacto con nuestra oficina. Harland Dingwall de fax es [place] [phone].  Si tiene un asunto urgente cuando la clnica est cerrada y que no puede esperar hasta el siguiente da hbil, puede llamar/localizar a su doctor(a) al nmero que aparece a continuacin.   Por favor, tenga en cuenta que aunque hacemos todo lo posible para estar disponibles para asuntos urgentes fuera del horario de [place], no estamos disponibles las 24 horas del da, los 7 das de la [employer].   Si tiene un problema urgente y no puede comunicarse con nosotros, puede optar por buscar atencin mdica  en el consultorio de su doctor(a), en una clnica privada, en un centro de atencin urgente o en una sala de emergencias.  Si tiene Engineering geologist, por favor llame inmediatamente al 911 o vaya a la sala de emergencias.  Nmeros de bper  - Dr. Nehemiah Massed: [phone]  - Dra. Moye: [phone]  - Dra. Nicole Kindred: [phone]  En caso de inclemencias del [place], por favor llame a Johnsie Kindred principal al [phone] para una actualizacin sobre el [place]  de cualquier retraso o cierre.  Consejos para la medicacin en dermatologa: Por favor, guarde las cajas en las que vienen los medicamentos de uso tpico para ayudarle a seguir las instrucciones sobre dnde y cmo usarlos. Las farmacias generalmente imprimen las instrucciones del medicamento slo en las cajas y no directamente en los tubos del [place].   Si su medicamento es muy caro, por favor, pngase en contacto con Zigmund Daniel llamando al [phone] y presione la opcin 4 o envenos un mensaje a travs de  MyChart.   No podemos decirle cul ser su copago por los medicamentos por adelantado ya que esto es diferente dependiendo de la cobertura de su seguro. Sin embargo, es posible que podamos encontrar un medicamento sustituto a Electrical engineer un formulario para que el seguro cubra el medicamento que se considera necesario.   Si se requiere una autorizacin previa para que su compaa de seguros [place] su medicamento, por favor permtanos de 1 a 2 das hbiles para completar este proceso.  Los precios de los medicamentos varan con frecuencia dependiendo del Environmental consultant de dnde se surte la receta y alguna farmacias pueden ofrecer precios ms baratos.  El sitio web www.goodrx.com tiene cupones para medicamentos de Airline pilot. Los precios aqu no tienen en cuenta lo que podra costar con la ayuda del seguro (puede ser ms barato con su seguro), pero el sitio web puede darle el precio si no utiliz Research scientist (physical sciences).  - Puede imprimir el cupn correspondiente y llevarlo con su receta a la farmacia.  - Tambin puede pasar por nuestra oficina durante el horario de atencin regular y Charity fundraiser una tarjeta de cupones de GoodRx.  - Si necesita que su receta se enve electrnicamente a una farmacia diferente, informe a nuestra oficina a travs de MyChart de [place] o por telfono llamando al [phone] y presione la opcin 4.

## 2022-07-16 ENCOUNTER — Encounter: Payer: Self-pay | Admitting: Dermatology

## 2022-07-19 ENCOUNTER — Other Ambulatory Visit: Payer: Self-pay | Admitting: Family Medicine

## 2022-08-08 ENCOUNTER — Ambulatory Visit (INDEPENDENT_AMBULATORY_CARE_PROVIDER_SITE_OTHER): Payer: Medicare HMO

## 2022-08-08 DIAGNOSIS — Z23 Encounter for immunization: Secondary | ICD-10-CM

## 2022-09-20 ENCOUNTER — Ambulatory Visit (INDEPENDENT_AMBULATORY_CARE_PROVIDER_SITE_OTHER): Payer: Medicare HMO

## 2022-09-20 VITALS — Ht 61.5 in | Wt 126.0 lb

## 2022-09-20 DIAGNOSIS — Z0000 Encounter for general adult medical examination without abnormal findings: Secondary | ICD-10-CM | POA: Diagnosis not present

## 2022-09-20 NOTE — Progress Notes (Signed)
Subjective:   Martha Hayes is a 71 y.o. female who presents for Medicare Annual (Subsequent) preventive examination.  Review of Systems    No ROS.  Medicare Wellness Virtual Visit.  Visual/audio telehealth visit, UTA vital signs.   See social history for additional risk factors.   Cardiac Risk Factors include: advanced age (>71mn, >>70women);hypertension     Objective:    Today's Vitals   09/20/22 1133  Weight: 126 lb (57.2 kg)  Height: 5' 1.5" (1.562 m)   Body mass index is 23.42 kg/m.     09/20/2022   11:35 AM 08/05/2017    8:24 AM 07/05/2016    2:38 PM  Advanced Directives  Does Patient Have a Medical Advance Directive? Yes Yes Yes  Type of [place] will  Does patient want to make changes to medical advance directive? No - Patient declined  No - Patient declined  Copy of [place] Chart? No - copy requested No - copy requested No - copy requested    Current Medications (verified) Outpatient Encounter Medications as of 09/20/2022  Medication Sig   carvedilol (COREG) 6.25 MG tablet Take 1 tablet (6.25 mg total) by mouth 2 (two) times daily with a meal.   Cholecalciferol (VITAMIN D3) 25 MCG (1000 UT) CAPS Take 1 capsule by mouth daily at 2 PM.   fluorouracil (EFUDEX) 5 % cream Apply topically 2 (two) times daily. Twice daily x 7 days to affected area at left temple   hydrochlorothiazide (MICROZIDE) 12.5 MG capsule TAKE 1 CAPSULE BY MOUTH EVERY DAY   lisinopril (ZESTRIL) 10 MG tablet TAKE 1 TABLET BY MOUTH EVERY DAY   simvastatin (ZOCOR) 20 MG tablet TAKE 1 TABLET BY MOUTH EVERY DAY IN THE EVENING   No facility-administered encounter medications on file as of 09/20/2022.    Allergies (verified) Penicillins   History: Past Medical History:  Diagnosis Date   Anemia, unspecified    due to menorrhagia   DDD (degenerative  disc disease), lumbosacral    MRI lumbar 2003   Hyperlipidemia    Hypertension    PSVT (paroxysmal supraventricular tachycardia)    Squamous cell carcinoma of skin 09/13/2020   left lat zygoma adjacent to scar, Moh's 10/24/20   Past Surgical History:  Procedure Laterality Date   BUNIONECTOMY  1997   right   PARTIAL HYSTERECTOMY  1992   uterine fibroids   TONSILLECTOMY  1959   Family History  Problem Relation Age of Onset   Diabetes Father    Hypertension Father    Seizures Father        petit mal seizure disorder   Heart disease Father        defibrillation   Heart disease Mother        CAD, two stents   Diabetes Mother        developed in 556's  Osteoporosis Mother    Hypertension Mother    Heart disease Sister        pacemaker   Sudden Cardiac Death Sister 569      aborted; had h/o bundle branch block   Social History   Socioeconomic History   Marital status: Married    Spouse name: Not on file   Number of children: Not on file   Years of education: Not on file   Highest education level: Not on file  Occupational History   Occupation:  RN    Employer: [employer]    Comment: [employer]  Tobacco Use   Smoking status: Never   Smokeless tobacco: Never  Vaping Use   Vaping Use: Never used  Substance and Sexual Activity   Alcohol use: Yes    Alcohol/week: 5.0 standard drinks of alcohol    Types: 5 Glasses of wine per week    Comment: weekly   Drug use: No   Sexual activity: Not on file  Other Topics Concern   Not on file  Social History Narrative   Not on file   Social Determinants of Health   Financial Resource Strain: Low Risk  (09/20/2022)   Overall Financial Resource Strain (CARDIA)    Difficulty of Paying Living Expenses: Not hard at all  Food Insecurity: No Food Insecurity (09/20/2022)   Hunger Vital Sign    Worried About Running Out of Food in the Last Year: Never true    Ran Out of Food in the Last Year: Never true  Transportation Needs:  No Transportation Needs (09/20/2022)   PRAPARE - Hydrologist (Medical): No    Lack of Transportation (Non-Medical): No  Physical Activity: Sufficiently Active (09/20/2022)   Exercise Vital Sign    Days of Exercise per Week: 7 days    Minutes of Exercise per Session: 80 min  Stress: No Stress Concern Present (09/20/2022)   [place]    Feeling of Stress : Not at all  Social Connections: Unknown (09/20/2022)   Social Connection and Isolation Panel [NHANES]    Frequency of Communication with Friends and Family: Not on file    Frequency of Social Gatherings with Friends and Family: Not on file    Attends Religious Services: Not on file    Active Member of Clubs or Organizations: Not on file    Attends Archivist Meetings: Not on file    Marital Status: Married    Tobacco Counseling Counseling given: Not Answered   Clinical Intake:  Pre-visit preparation completed: Yes        Diabetes: No  How often do you need to have someone help you when you read instructions, pamphlets, or other written materials from your doctor or pharmacy?: 1 - Never   Interpreter Needed?: No      Activities of Daily Living    09/20/2022   11:36 AM  In your present state of health, do you have any difficulty performing the following activities:  Hearing? 0  Vision? 0  Difficulty concentrating or making decisions? 0  Walking or climbing stairs? 0  Dressing or bathing? 0  Doing errands, shopping? 0  Preparing Food and eating ? N  Using the Toilet? N  In the past six months, have you accidently leaked urine? N  Do you have problems with loss of bowel control? N  Managing your Medications? N  Managing your Finances? N  Housekeeping or managing your Housekeeping? N    Patient Care Team: Jinny Sanders, MD as PCP - General End, Harrell Gave, MD as Consulting Physician  (Cardiology)  Indicate any recent Medical Services you may have received from other than [facility] providers in the past year (date may be approximate).     Assessment:   This is a routine wellness examination for [employer].  I connected with  Margaretha Glassing on 09/20/22 by a audio enabled telemedicine application and verified that I am speaking with the correct person using two  identifiers.  Patient Location: Home  Provider Location: Office/Clinic  I discussed the limitations of evaluation and management by telemedicine. The patient expressed understanding and agreed to proceed.   Hearing/Vision screen Hearing Screening - Comments:: Patient is able to hear conversational tones without difficulty.  No issues reported.   Vision Screening - Comments:: Followed by [facility] Wears corrective lenses They have seen their ophthalmologist in the last 12 months.    Dietary issues and exercise activities discussed: Current Exercise Habits: Home exercise routine, Type of exercise: walking, Time (Minutes): > 60, Frequency (Times/Week): 5, Weekly Exercise (Minutes/Week): 0, Intensity: Moderate   Goals Addressed             This Visit's Progress    Increase physical activity   On track    Starting 08/05/2017, I will continue to walk at least 30 min 7 days per week.        Depression Screen    09/20/2022   12:04 PM 09/18/2021    9:54 AM 09/08/2020   11:14 AM 09/03/2019   10:03 AM 09/01/2018   10:19 AM 08/05/2017    8:11 AM 07/05/2016    2:05 PM  PHQ 2/9 Scores  PHQ - 2 Score 0 0 0 0 0 0 0  PHQ- 9 Score      0     Fall Risk    09/20/2022   11:35 AM 09/18/2021    9:54 AM 09/08/2020   11:14 AM 09/03/2019   10:06 AM 09/10/2018    1:07 PM  [place] in the past year? 0 1 1 0 0  Comment     Emmi Telephone Survey: data to providers prior to load  Number falls in past yr: 0 0 1 0   Injury with Fall?  0 0    Risk for fall due to : No Fall Risks      Follow up Falls  evaluation completed;Falls prevention discussed        FALL RISK PREVENTION PERTAINING TO THE HOME: Home free of loose throw rugs in walkways, pet beds, electrical cords, etc? Yes  Adequate lighting in your home to reduce risk of falls? Yes   ASSISTIVE DEVICES UTILIZED TO PREVENT FALLS: Life alert? No  Use of a cane, walker or w/c? No  Grab bars in the bathroom? No  Shower chair or bench in shower? No  Elevated toilet seat or a handicapped toilet? No   TIMED UP AND GO: Was the test performed? No .   Cognitive Function:    08/05/2017    8:11 AM  MMSE - Mini Mental State Exam  Orientation to time 5  Orientation to Place 5  Registration 3  Attention/ Calculation 0  Recall 3  Language- name 2 objects 0  Language- repeat 1  Language- follow 3 step command 3  Language- read & follow direction 0  Write a sentence 0  Copy design 0  Total score 20        09/20/2022   11:38 AM  6CIT Screen  What Year? 0 points  What month? 0 points  What time? 0 points  Count back from 20 0 points  Months in reverse 0 points  Repeat phrase 0 points  Total Score 0 points    Immunizations Immunization History  Administered Date(s) Administered   Fluad Quad(high Dose 65+) 08/12/2019, 08/16/2020, 08/08/2022   Influenza Whole 10/21/2005, 07/22/2007, 07/21/2009   Influenza, High Dose Seasonal PF 08/25/2018, 08/15/2021  Influenza, Seasonal, Injecte, Preservative Fre 09/05/2015, 07/29/2016   Influenza,inj,Quad PF,6+ Mos 08/05/2017   PFIZER(Purple Top)SARS-COV-2 Vaccination 11/12/2019, 12/03/2019, 09/07/2020   Pneumococcal Conjugate-13 07/29/2016   Pneumococcal Polysaccharide-23 08/05/2017   Td 12/19/2005   Zoster Recombinat (Shingrix) 04/03/2021, 07/18/2021   Zoster, Live 04/15/2011   TDAP status: Due, Education has been provided regarding the importance of this vaccine. Advised may receive this vaccine at local pharmacy or Health Dept. Aware to provide a copy of the vaccination record  if obtained from local pharmacy or Health Dept. Verbalized acceptance and understanding.  Covid-19 vaccine status: Completed vaccines x3.  Screening Tests Health Maintenance  Topic Date Due   DTaP/Tdap/Td (2 - Tdap) 12/20/2015   COVID-19 Vaccine (4 - 2023-24 season) 10/06/2022 (Originally 06/21/2022)   MAMMOGRAM  12/04/2022   Medicare Annual Wellness (AWV)  09/21/2023   COLONOSCOPY (Pts 45-8yr Insurance coverage will need to be confirmed)  03/06/2028   Pneumonia Vaccine 71 Years old  Completed   INFLUENZA VACCINE  Completed   DEXA SCAN  Completed   Hepatitis C Screening  Completed   Zoster Vaccines- Shingrix  Completed   HPV VACCINES  Aged Out   Health Maintenance Health Maintenance Due  Topic Date Due   DTaP/Tdap/Td (2 - Tdap) 12/20/2015   Lung Cancer Screening: (Low Dose CT Chest recommended if Age 71-80years, 30 pack-year currently smoking OR have quit w/in 15years.) does not qualify.   Hepatitis C Screening: Completed 2009.  Vision Screening: Recommended annual ophthalmology exams for early detection of glaucoma and other disorders of the eye.  Dental Screening: Recommended annual dental exams for proper oral hygiene.  Community Resource Referral / Chronic Care Management: CRR required this visit?  No   CCM required this visit?  No      Plan:     I have personally reviewed and noted the following in the patient's chart:   Medical and social history Use of alcohol, tobacco or illicit drugs  Current medications and supplements including opioid prescriptions. Patient is not currently taking opioid prescriptions. Functional ability and status Nutritional status Physical activity Advanced directives List of other physicians Hospitalizations, surgeries, and ER visits in previous 12 months Vitals Screenings to include cognitive, depression, and falls Referrals and appointments  In addition, I have reviewed and discussed with patient certain preventive  protocols, quality metrics, and best practice recommendations. A written personalized care plan for preventive services as well as general preventive health recommendations were provided to patient.     DLeta Jungling LPN   [id number]

## 2022-09-20 NOTE — Patient Instructions (Addendum)
Martha Hayes , Thank you for taking time to come for your Medicare Wellness Visit. I appreciate your ongoing commitment to your health goals. Please review the following plan we discussed and let me know if I can assist you in the future.   These are the goals we discussed:  Goals      Increase physical activity     Starting 08/05/2017, I will continue to walk at least 30 min 7 days per week.         This is a list of the screening recommended for you and due dates:  Health Maintenance  Topic Date Due   DTaP/Tdap/Td vaccine (2 - Tdap) 12/20/2015   COVID-19 Vaccine (4 - 2023-24 season) 10/06/2022*   Mammogram  12/04/2022   Medicare Annual Wellness Visit  09/21/2023   Colon Cancer Screening  03/06/2028   Pneumonia Vaccine  Completed   Flu Shot  Completed   DEXA scan (bone density measurement)  Completed   Hepatitis C Screening: USPSTF Recommendation to screen - Ages 34-79 yo.  Completed   Zoster (Shingles) Vaccine  Completed   HPV Vaccine  Aged Out  *Topic was postponed. The date shown is not the original due date.    Advanced directives: End of life planning; Advance aging; Advanced directives discussed.  Copy of current HCPOA/Living Will requested.    Conditions/risks identified: none new  Next appointment: Follow up in one year for your annual wellness visit    Preventive Care 65 Years and Older, Female Preventive care refers to lifestyle choices and visits with your health care provider that can promote health and wellness. What does preventive care include? A yearly physical exam. This is also called an annual well check. Dental exams once or twice a year. Routine eye exams. Ask your health care provider how often you should have your eyes checked. Personal lifestyle choices, including: Daily care of your teeth and gums. Regular physical activity. Eating a healthy diet. Avoiding tobacco and drug use. Limiting alcohol use. Practicing safe sex. Taking low-dose aspirin  every day. Taking vitamin and mineral supplements as recommended by your health care provider. What happens during an annual well check? The services and screenings done by your health care provider during your annual well check will depend on your age, overall health, lifestyle risk factors, and family history of disease. Counseling  Your health care provider may ask you questions about your: Alcohol use. Tobacco use. Drug use. Emotional well-being. Home and relationship well-being. Sexual activity. Eating habits. History of falls. Memory and ability to understand (cognition). Work and work Statistician. Reproductive health. Screening  You may have the following tests or measurements: Height, weight, and BMI. Blood pressure. Lipid and cholesterol levels. These may be checked every 5 years, or more frequently if you are over 73 years old. Skin check. Lung cancer screening. You may have this screening every year starting at age 7 if you have a 30-pack-year history of smoking and currently smoke or have quit within the past 15 years. Fecal occult blood test (FOBT) of the stool. You may have this test every year starting at age 18. Flexible sigmoidoscopy or colonoscopy. You may have a sigmoidoscopy every 5 years or a colonoscopy every 10 years starting at age 63. Hepatitis C blood test. Hepatitis B blood test. Sexually transmitted disease (STD) testing. Diabetes screening. This is done by checking your blood sugar (glucose) after you have not eaten for a while (fasting). You may have this done every 1-3 years. Bone  density scan. This is done to screen for osteoporosis. You may have this done starting at age 11. Mammogram. This may be done every 1-2 years. Talk to your health care provider about how often you should have regular mammograms. Talk with your health care provider about your test results, treatment options, and if necessary, the need for more tests. Vaccines  Your health  care provider may recommend certain vaccines, such as: Influenza vaccine. This is recommended every year. Tetanus, diphtheria, and acellular pertussis (Tdap, Td) vaccine. You may need a Td booster every 10 years. Zoster vaccine. You may need this after age 34. Pneumococcal 13-valent conjugate (PCV13) vaccine. One dose is recommended after age 30. Pneumococcal polysaccharide (PPSV23) vaccine. One dose is recommended after age 61. Talk to your health care provider about which screenings and vaccines you need and how often you need them. This information is not intended to replace advice given to you by your health care provider. Make sure you discuss any questions you have with your health care provider. Document Released: 11/03/2015 Document Revised: 06/26/2016 Document Reviewed: 08/08/2015 Elsevier Interactive Patient Education  2017 [place] Prevention in the Home Falls can cause injuries. They can happen to people of all ages. There are many things you can do to make your home safe and to help prevent falls. What can I do on the outside of my home? Regularly fix the edges of walkways and driveways and fix any cracks. Remove anything that might make you trip as you walk through a door, such as a raised step or threshold. Trim any bushes or trees on the path to your home. Use bright outdoor lighting. Clear any walking paths of anything that might make someone trip, such as rocks or tools. Regularly check to see if handrails are loose or broken. Make sure that both sides of any steps have handrails. Any raised decks and porches should have guardrails on the edges. Have any leaves, snow, or ice cleared regularly. Use sand or salt on walking paths during winter. Clean up any spills in your garage right away. This includes oil or grease spills. What can I do in the bathroom? Use night lights. Install grab bars by the toilet and in the tub and shower. Do not use towel bars as grab  bars. Use non-skid mats or decals in the tub or shower. If you need to sit down in the shower, use a plastic, non-slip stool. Keep the floor dry. Clean up any water that spills on the floor as soon as it happens. Remove soap buildup in the tub or shower regularly. Attach bath mats securely with double-sided non-slip rug tape. Do not have throw rugs and other things on the floor that can make you trip. What can I do in the bedroom? Use night lights. Make sure that you have a light by your bed that is easy to reach. Do not use any sheets or blankets that are too big for your bed. They should not hang down onto the floor. Have a firm chair that has side arms. You can use this for support while you get dressed. Do not have throw rugs and other things on the floor that can make you trip. What can I do in the kitchen? Clean up any spills right away. Avoid walking on wet floors. Keep items that you use a lot in easy-to-reach places. If you need to reach something above you, use a strong step stool that has a grab bar. Keep  electrical cords out of the way. Do not use floor polish or wax that makes floors slippery. If you must use wax, use non-skid floor wax. Do not have throw rugs and other things on the floor that can make you trip. What can I do with my stairs? Do not leave any items on the stairs. Make sure that there are handrails on both sides of the stairs and use them. Fix handrails that are broken or loose. Make sure that handrails are as long as the stairways. Check any carpeting to make sure that it is firmly attached to the stairs. Fix any carpet that is loose or worn. Avoid having throw rugs at the top or bottom of the stairs. If you do have throw rugs, attach them to the floor with carpet tape. Make sure that you have a light switch at the top of the stairs and the bottom of the stairs. If you do not have them, ask someone to add them for you. What else can I do to help prevent  falls? Wear shoes that: Do not have high heels. Have rubber bottoms. Are comfortable and fit you well. Are closed at the toe. Do not wear sandals. If you use a stepladder: Make sure that it is fully opened. Do not climb a closed stepladder. Make sure that both sides of the stepladder are locked into place. Ask someone to hold it for you, if possible. Clearly mark and make sure that you can see: Any grab bars or handrails. First and last steps. Where the edge of each step is. Use tools that help you move around (mobility aids) if they are needed. These include: Canes. Walkers. Scooters. Crutches. Turn on the lights when you go into a dark area. Replace any light bulbs as soon as they burn out. Set up your furniture so you have a clear path. Avoid moving your furniture around. If any of your floors are uneven, fix them. If there are any pets around you, be aware of where they are. Review your medicines with your doctor. Some medicines can make you feel dizzy. This can increase your chance of falling. Ask your doctor what other things that you can do to help prevent falls. This information is not intended to replace advice given to you by your health care provider. Make sure you discuss any questions you have with your health care provider. Document Released: 08/03/2009 Document Revised: 03/14/2016 Document Reviewed: 11/11/2014 Elsevier Interactive Patient Education  2017 [employer].

## 2022-09-24 ENCOUNTER — Ambulatory Visit (INDEPENDENT_AMBULATORY_CARE_PROVIDER_SITE_OTHER): Payer: Medicare HMO | Admitting: Family Medicine

## 2022-09-24 VITALS — BP 122/68 | HR 65 | Temp 98.1°F | Ht 61.5 in | Wt 123.4 lb

## 2022-09-24 DIAGNOSIS — E7800 Pure hypercholesterolemia, unspecified: Secondary | ICD-10-CM

## 2022-09-24 DIAGNOSIS — I471 Supraventricular tachycardia, unspecified: Secondary | ICD-10-CM

## 2022-09-24 DIAGNOSIS — I10 Essential (primary) hypertension: Secondary | ICD-10-CM | POA: Diagnosis not present

## 2022-09-24 DIAGNOSIS — Z0000 Encounter for general adult medical examination without abnormal findings: Secondary | ICD-10-CM

## 2022-09-24 DIAGNOSIS — M8580 Other specified disorders of bone density and structure, unspecified site: Secondary | ICD-10-CM | POA: Diagnosis not present

## 2022-09-24 DIAGNOSIS — R7303 Prediabetes: Secondary | ICD-10-CM | POA: Diagnosis not present

## 2022-09-24 LAB — LIPID PANEL
Cholesterol: 192 mg/dL (ref 0–200)
HDL: 53.7 mg/dL (ref >39.00)
LDL Cholesterol: 110 mg/dL — ABNORMAL HIGH (ref 0–99)
NonHDL: 137.89
Total CHOL/HDL Ratio: 4
Triglycerides: 137 mg/dL (ref 0.0–149.0)
VLDL: 27.4 mg/dL (ref 0.0–40.0)

## 2022-09-24 LAB — COMPREHENSIVE METABOLIC PANEL
ALT: 18 U/L (ref 0–35)
AST: 19 U/L (ref 0–37)
Albumin: 4.7 g/dL (ref 3.5–5.2)
Alkaline Phosphatase: 95 U/L (ref 39–117)
BUN: 16 mg/dL (ref 6–23)
CO2: 29 mEq/L (ref 19–32)
Calcium: 9.6 mg/dL (ref 8.4–10.5)
Chloride: 100 mEq/L (ref 96–112)
Creatinine, Ser: 0.68 mg/dL (ref 0.40–1.20)
GFR: 87.43 mL/min (ref >60.00)
Glucose, Bld: 107 mg/dL — ABNORMAL HIGH (ref 70–99)
Potassium: 4 mEq/L (ref 3.5–5.1)
Sodium: 137 mEq/L (ref 135–145)
Total Bilirubin: 0.8 mg/dL (ref 0.2–1.2)
Total Protein: 7.4 g/dL (ref 6.0–8.3)

## 2022-09-24 LAB — HEMOGLOBIN A1C: Hgb A1c MFr Bld: 6 % (ref 4.6–6.5)

## 2022-09-24 NOTE — Assessment & Plan Note (Signed)
Stable, chronic.  Continue current medication.   Coreg 6.25 mg  BID

## 2022-09-24 NOTE — Assessment & Plan Note (Signed)
Stable, chronic.  Continue current medication.  Lisinopril 10 mg daily  Coreg 6.25 mg  BID HCTZ 25 mg daily

## 2022-09-24 NOTE — Progress Notes (Signed)
Patient ID: Martha Hayes, female    DOB: October 17, 1951, 71 y.o.   MRN: [id number]  This visit was conducted in person.  BP 122/68   Pulse 65   Temp 98.1 F (36.7 C)   Ht 5' 1.5" (1.562 m)   Wt 123 lb 6.4 oz (56 kg)   SpO2 100%   BMI 22.94 kg/m    CC: annual   Subjective:   HPI: Martha Hayes is a 71 y.o. female presenting on 09/24/2022  The patient presents for annual medicare wellness, complete physical and review of chronic health problems. He/She also has the following acute concerns today: none   The patient saw a LPN or RN for medicare wellness visit.  Prevention and wellness was reviewed in detail. Note reviewed and important notes copied below.   Hypertension:  well controlled on  Coreg, lisinopril  and HCTZ.  BP Readings from Last 3 Encounters:  09/24/22 122/68  06/05/22 122/70  01/09/22 140/66  Using medication without problems or lightheadedness:  none Chest pain with exertion: none Edema:none Short of breath: none Average home BPs: Other issues:  Elevated Cholesterol:  Due for re-eval. on Simvastatin 20 mg daily Lab Results  Component Value Date   CHOL 188 09/18/2021   HDL 58.90 09/18/2021   LDLCALC 101 (H) 09/18/2021   LDLDIRECT 153.4 05/07/2012   TRIG 143.0 09/18/2021   CHOLHDL 3 09/18/2021  Using medications without problems: none Muscle aches:  none Diet compliance: heart healthy diet Exercise: 5 days a week.. 4 miles Other complaints:  Prediabetes  Wt Readings from Last 3 Encounters:  09/24/22 123 lb 6.4 oz (56 kg)  09/20/22 126 lb (57.2 kg)  06/05/22 126 lb 4 oz (57.3 kg)   Body mass index is 22.94 kg/m. Seeing dermatolology yearly.      Relevant past medical, surgical, family and social history reviewed and updated as indicated. Interim medical history since our last visit reviewed. Allergies and medications reviewed and updated. Outpatient Medications Prior to Visit  Medication Sig Dispense Refill   carvedilol (COREG) 6.25 MG  tablet Take 1 tablet (6.25 mg total) by mouth 2 (two) times daily with a meal. 180 tablet 3   Cholecalciferol (VITAMIN D3) 25 MCG (1000 UT) CAPS Take 1 capsule by mouth daily at 2 PM.     hydrochlorothiazide (MICROZIDE) 12.5 MG capsule TAKE 1 CAPSULE BY MOUTH EVERY DAY 90 capsule 3   lisinopril (ZESTRIL) 10 MG tablet TAKE 1 TABLET BY MOUTH EVERY DAY 90 tablet 0   simvastatin (ZOCOR) 20 MG tablet TAKE 1 TABLET BY MOUTH EVERY DAY IN THE EVENING 90 tablet 3   fluorouracil (EFUDEX) 5 % cream Apply topically 2 (two) times daily. Twice daily x 7 days to affected area at left temple (Patient not taking: Reported on 09/24/2022) 40 g 0   No facility-administered medications prior to visit.     Per HPI unless specifically indicated in ROS section below Review of Systems  Constitutional:  Negative for fatigue and fever.  HENT:  Negative for congestion.   Eyes:  Negative for pain.  Respiratory:  Negative for cough and shortness of breath.   Cardiovascular:  Negative for chest pain, palpitations and leg swelling.  Gastrointestinal:  Negative for abdominal pain.  Genitourinary:  Negative for dysuria and vaginal bleeding.  Musculoskeletal:  Negative for back pain.  Neurological:  Negative for syncope, light-headedness and headaches.  Psychiatric/Behavioral:  Negative for dysphoric mood.    Objective:  BP 122/68   Pulse 65  Temp 98.1 F (36.7 C)   Ht 5' 1.5" (1.562 m)   Wt 123 lb 6.4 oz (56 kg)   SpO2 100%   BMI 22.94 kg/m   Wt Readings from Last 3 Encounters:  09/24/22 123 lb 6.4 oz (56 kg)  09/20/22 126 lb (57.2 kg)  06/05/22 126 lb 4 oz (57.3 kg)      Physical Exam Vitals and nursing note reviewed.  Constitutional:      General: She is not in acute distress.    Appearance: Normal appearance. She is well-developed. She is not ill-appearing or toxic-appearing.  HENT:     Head: Normocephalic.     Right Ear: Hearing, tympanic membrane, ear canal and external ear normal.     Left Ear:  Hearing, tympanic membrane, ear canal and external ear normal.     Nose: Nose normal.  Eyes:     General: Lids are normal. Lids are everted, no foreign bodies appreciated.     Conjunctiva/sclera: Conjunctivae normal.     Pupils: Pupils are equal, round, and reactive to light.  Neck:     Thyroid: No thyroid mass or thyromegaly.     Vascular: No carotid bruit.     Trachea: Trachea normal.  Cardiovascular:     Rate and Rhythm: Normal rate and regular rhythm.     Heart sounds: Normal heart sounds, S1 normal and S2 normal. No murmur heard.    No gallop.  Pulmonary:     Effort: Pulmonary effort is normal. No respiratory distress.     Breath sounds: Normal breath sounds. No wheezing, rhonchi or rales.  Abdominal:     General: Bowel sounds are normal. There is no distension or abdominal bruit.     Palpations: Abdomen is soft. There is no fluid wave or mass.     Tenderness: There is no abdominal tenderness. There is no guarding or rebound.     Hernia: No hernia is present.  Musculoskeletal:     Cervical back: Normal range of motion and neck supple.  Lymphadenopathy:     Cervical: No cervical adenopathy.  Skin:    General: Skin is warm and dry.     Findings: No rash.  Neurological:     Mental Status: She is alert.     Cranial Nerves: No cranial nerve deficit.     Sensory: No sensory deficit.  Psychiatric:        Mood and Affect: Mood is not anxious or depressed.        Speech: Speech normal.        Behavior: Behavior normal. Behavior is cooperative.        Judgment: Judgment normal.       Results for orders placed or performed in visit on 09/18/21  Comprehensive metabolic panel  Result Value Ref Range   Sodium 134 (L) 135 - 145 mEq/L   Potassium 4.5 3.5 - 5.1 mEq/L   Chloride 99 96 - 112 mEq/L   CO2 29 19 - 32 mEq/L   Glucose, Bld 114 (H) 70 - 99 mg/dL   BUN 12 6 - 23 mg/dL   Creatinine, Ser 0.76 0.40 - 1.20 mg/dL   Total Bilirubin 0.7 0.2 - 1.2 mg/dL   Alkaline Phosphatase  99 39 - 117 U/L   AST 20 0 - 37 U/L   ALT 22 0 - 35 U/L   Total Protein 7.6 6.0 - 8.3 g/dL   Albumin 4.6 3.5 - 5.2 g/dL   GFR 79.23 >60.00 mL/min  Calcium 9.7 8.4 - 10.5 mg/dL  Lipid panel  Result Value Ref Range   Cholesterol 188 0 - 200 mg/dL   Triglycerides 143.0 0.0 - 149.0 mg/dL   HDL 58.90 >39.00 mg/dL   VLDL 28.6 0.0 - 40.0 mg/dL   LDL Cholesterol 101 (H) 0 - 99 mg/dL   Total CHOL/HDL Ratio 3    NonHDL 129.23   Hemoglobin A1c  Result Value Ref Range   Hgb A1c MFr Bld 6.0 4.6 - 6.5 %     COVID 19 screen:  No recent travel or known exposure to COVID19 The patient denies respiratory symptoms of COVID 19 at this time. The importance of social distancing was discussed today.   Assessment and Plan The patient's preventative maintenance and recommended screening tests for an annual wellness exam were reviewed in full today. Brought up to date unless services declined.  Counselled on the importance of diet, exercise, and its role in overall health and mortality. The patient's FH and SH was reviewed, including their home life, tobacco status, and drug and alcohol status.    Vaccines: Uptodate zoster, flu, PCV, COVID x 3, shingrix. Consider Td . PAP/DVE: partial hysterectomy,  No family history of ovarian cancer,DVE not indicated. Mammo:  05/2022,  yearly Colonoscopy: Dr. Watt Climes Date:  02/2018  Results: Diverticulosis , hemorrhoids, repeat in 10 years DEXA: 2010.. Osteopenia.. Taking vit D. Does not EVER want to do DEXA, would not use meds to treat.   Vit D in nml range. HEP C screen: done HIV screen: refused nonsmoker   Problem List Items Addressed This Visit     Essential hypertension (Chronic)    Stable, chronic.  Continue current medication.  Lisinopril 10 mg daily  Coreg 6.25 mg  BID HCTZ 25 mg daily      Osteopenia (Chronic)    Not interested in DEXA eval. Recommend weight bearing exercise, calcium in diet and vit D supplement 400 IU 1-2 times daily.        Prediabetes (Chronic)    Due for re-eval.      Relevant Orders   Hemoglobin A1c   PSVT (paroxysmal supraventricular tachycardia) (Chronic)    Stable, chronic.  Continue current medication.   Coreg 6.25 mg  BID       Pure hypercholesterolemia (Chronic)    Due for re-eval.      Relevant Orders   Lipid panel   Comprehensive metabolic panel   Other Visit Diagnoses     Routine general medical examination at a health care facility    -  Primary        Eliezer Lofts, MD

## 2022-09-24 NOTE — Assessment & Plan Note (Signed)
Due for re-eval. 

## 2022-09-24 NOTE — Assessment & Plan Note (Signed)
Not interested in DEXA eval. Recommend weight bearing exercise, calcium in diet and vit D supplement 400 IU 1-2 times daily.

## 2022-09-25 ENCOUNTER — Encounter: Payer: Self-pay | Admitting: Family Medicine

## 2022-09-27 MED ORDER — ATORVASTATIN CALCIUM 20 MG PO TABS: 20.0000 mg | ORAL_TABLET | Freq: Every day | ORAL | 3 refills | Status: DC

## 2022-09-28 ENCOUNTER — Other Ambulatory Visit: Payer: Self-pay | Admitting: Family Medicine

## 2022-11-01 ENCOUNTER — Other Ambulatory Visit: Payer: Self-pay | Admitting: Family Medicine

## 2022-11-21 DIAGNOSIS — M2012 Hallux valgus (acquired), left foot: Secondary | ICD-10-CM | POA: Diagnosis not present

## 2022-11-21 DIAGNOSIS — M79672 Pain in left foot: Secondary | ICD-10-CM | POA: Diagnosis not present

## 2022-11-24 ENCOUNTER — Other Ambulatory Visit: Payer: Self-pay | Admitting: Family Medicine

## 2022-11-27 ENCOUNTER — Other Ambulatory Visit: Payer: Self-pay | Admitting: Podiatry

## 2022-12-09 DIAGNOSIS — H2513 Age-related nuclear cataract, bilateral: Secondary | ICD-10-CM | POA: Diagnosis not present

## 2022-12-09 DIAGNOSIS — H43812 Vitreous degeneration, left eye: Secondary | ICD-10-CM | POA: Diagnosis not present

## 2022-12-09 DIAGNOSIS — H40033 Anatomical narrow angle, bilateral: Secondary | ICD-10-CM | POA: Diagnosis not present

## 2022-12-12 ENCOUNTER — Encounter: Payer: Self-pay | Admitting: Podiatry

## 2022-12-17 NOTE — Discharge Instructions (Signed)
[facility]  POST OPERATIVE INSTRUCTIONS FOR DR. FOWLER AND [facility] POD[facility]   Take your medication as prescribed.  Pain medication should be taken only as needed.  Keep the dressing clean, dry and intact.  Keep your foot elevated above the heart level for the first 48 hours.  Walking to the bathroom and brief periods of walking are acceptable, unless we have instructed you to be non-weight bearing.  Always wear your post-op shoe when walking.  Always use your crutches if you are to be non-weight bearing.  Do not take a shower. Baths are permissible as long as the foot is kept out of the water.   Every hour you are awake:  Bend your knee 15 times. Flex foot 15 times Massage calf 15 times  Call [facility] ([phone]) if any of the following problems occur: You develop a temperature or fever. The bandage becomes saturated with blood. Medication does not stop your pain. Injury of the foot occurs. Any symptoms of infection including redness, odor, or red streaks running from wound. 

## 2022-12-18 ENCOUNTER — Ambulatory Visit
Admission: RE | Admit: 2022-12-18 | Discharge: 2022-12-18 | Disposition: A | Discharge status: Home / Self Care | Payer: Medicare HMO | Attending: Podiatry | Admitting: Podiatry

## 2022-12-18 ENCOUNTER — Other Ambulatory Visit: Payer: Self-pay

## 2022-12-18 ENCOUNTER — Encounter: Payer: Self-pay | Admitting: Podiatry

## 2022-12-18 ENCOUNTER — Encounter
Admission: RE | Disposition: A | Discharge status: Home / Self Care | Payer: Self-pay | Source: Home / Self Care | Attending: Podiatry

## 2022-12-18 ENCOUNTER — Ambulatory Visit: Payer: Medicare HMO | Admitting: General Practice

## 2022-12-18 DIAGNOSIS — I471 Supraventricular tachycardia, unspecified: Secondary | ICD-10-CM | POA: Diagnosis not present

## 2022-12-18 DIAGNOSIS — E785 Hyperlipidemia, unspecified: Secondary | ICD-10-CM | POA: Insufficient documentation

## 2022-12-18 DIAGNOSIS — E7800 Pure hypercholesterolemia, unspecified: Secondary | ICD-10-CM | POA: Diagnosis not present

## 2022-12-18 DIAGNOSIS — Z85828 Personal history of other malignant neoplasm of skin: Secondary | ICD-10-CM | POA: Diagnosis not present

## 2022-12-18 DIAGNOSIS — M21619 Bunion of unspecified foot: Secondary | ICD-10-CM | POA: Diagnosis not present

## 2022-12-18 DIAGNOSIS — M2012 Hallux valgus (acquired), left foot: Secondary | ICD-10-CM | POA: Diagnosis not present

## 2022-12-18 DIAGNOSIS — I10 Essential (primary) hypertension: Secondary | ICD-10-CM | POA: Insufficient documentation

## 2022-12-18 DIAGNOSIS — M5137 Other intervertebral disc degeneration, lumbosacral region: Secondary | ICD-10-CM | POA: Diagnosis not present

## 2022-12-18 HISTORY — PX: BUNIONECTOMY: SHX129

## 2022-12-18 HISTORY — DX: Nausea with vomiting, unspecified: Z98.890

## 2022-12-18 SURGERY — BUNIONECTOMY
Anesthesia: General | Site: Toe | Laterality: Left

## 2022-12-18 MED ORDER — OXYCODONE-ACETAMINOPHEN 5-325 MG PO TABS: 1.0000 | ORAL_TABLET | Freq: Four times a day (QID) | ORAL | 0 refills | Status: DC | PRN

## 2022-12-18 MED ORDER — DEXAMETHASONE SODIUM PHOSPHATE 10 MG/ML IJ SOLN
INTRAMUSCULAR | Status: DC | PRN
  Administered 2022-12-18: 8 mg via INTRAVENOUS

## 2022-12-18 MED ORDER — CEFAZOLIN SODIUM-DEXTROSE 2-4 GM/100ML-% IV SOLN
2.0000 g | INTRAVENOUS | Status: AC
  Administered 2022-12-18: 2 g via INTRAVENOUS

## 2022-12-18 MED ORDER — BUPIVACAINE HCL (PF) 0.25 % IJ SOLN
INTRAMUSCULAR | Status: DC | PRN
  Administered 2022-12-18: 10 mL

## 2022-12-18 MED ORDER — ACETAMINOPHEN 10 MG/ML IV SOLN
INTRAVENOUS | Status: DC | PRN
  Administered 2022-12-18: 1000 mg via INTRAVENOUS

## 2022-12-18 MED ORDER — PROPOFOL 10 MG/ML IV BOLUS
INTRAVENOUS | Status: DC | PRN
  Administered 2022-12-18: 50 mg via INTRAVENOUS
  Administered 2022-12-18: 160 ug/kg/min via INTRAVENOUS
  Administered 2022-12-18: 50 mg via INTRAVENOUS

## 2022-12-18 MED ORDER — KETOROLAC TROMETHAMINE 15 MG/ML IJ SOLN
INTRAMUSCULAR | Status: DC | PRN
  Administered 2022-12-18: 15 mg via INTRAVENOUS

## 2022-12-18 MED ORDER — BUPIVACAINE LIPOSOME 1.3 % IJ SUSP
INTRAMUSCULAR | Status: DC | PRN
  Administered 2022-12-18: 10 mL

## 2022-12-18 MED ORDER — ONDANSETRON HCL 4 MG/2ML IJ SOLN
INTRAMUSCULAR | Status: DC | PRN
  Administered 2022-12-18: 4 mg via INTRAVENOUS

## 2022-12-18 MED ORDER — LIDOCAINE HCL (CARDIAC) PF 100 MG/5ML IV SOSY
PREFILLED_SYRINGE | INTRAVENOUS | Status: DC | PRN
  Administered 2022-12-18: 30 mg via INTRATRACHEAL

## 2022-12-18 MED ORDER — SODIUM CHLORIDE 0.9 % IR SOLN
Status: DC | PRN
  Administered 2022-12-18: 120 mL

## 2022-12-18 MED ORDER — MIDAZOLAM HCL 5 MG/5ML IJ SOLN
INTRAMUSCULAR | Status: DC | PRN
  Administered 2022-12-18: 2 mg via INTRAVENOUS

## 2022-12-18 MED ORDER — LACTATED RINGERS IV SOLN: INTRAVENOUS | Status: DC

## 2022-12-18 MED ORDER — OXYCODONE HCL 5 MG PO TABS: 5.0000 mg | ORAL_TABLET | Freq: Once | ORAL | Status: DC | PRN

## 2022-12-18 MED ORDER — EPHEDRINE SULFATE (PRESSORS) 50 MG/ML IJ SOLN
INTRAMUSCULAR | Status: DC | PRN
  Administered 2022-12-18 (×2): 5 mg via INTRAVENOUS
  Administered 2022-12-18: 10 mg via INTRAVENOUS

## 2022-12-18 MED ORDER — FENTANYL CITRATE (PF) 100 MCG/2ML IJ SOLN
INTRAMUSCULAR | Status: DC | PRN
  Administered 2022-12-18 (×4): 25 ug via INTRAVENOUS

## 2022-12-18 MED ORDER — FENTANYL CITRATE PF 50 MCG/ML IJ SOSY: 25.0000 ug | PREFILLED_SYRINGE | INTRAMUSCULAR | Status: DC | PRN

## 2022-12-18 MED ORDER — OXYCODONE HCL 5 MG/5ML PO SOLN: 5.0000 mg | Freq: Once | ORAL | Status: DC | PRN

## 2022-12-18 SURGICAL SUPPLY — 45 items
APL SKNCLS STERI-STRIP NONHPOA (GAUZE/BANDAGES/DRESSINGS) ×1
BENZOIN TINCTURE PRP APPL 2/3 (GAUZE/BANDAGES/DRESSINGS) ×2 IMPLANT
BLADE SAW LAPIPLASTY 40X11 (BLADE) IMPLANT
BNDG CMPR 5X4 CHSV STRCH STRL (GAUZE/BANDAGES/DRESSINGS) ×1
BNDG CMPR 75X41 PLY HI ABS (GAUZE/BANDAGES/DRESSINGS) ×1
BNDG CMPR STD VLCR NS LF 5.8X4 (GAUZE/BANDAGES/DRESSINGS) ×1
BNDG COHESIVE 4X5 TAN STRL LF (GAUZE/BANDAGES/DRESSINGS) ×2 IMPLANT
BNDG ELASTIC 4X5.8 VLCR NS LF (GAUZE/BANDAGES/DRESSINGS) ×2 IMPLANT
BNDG ELASTIC 4X5.8 VLCR STR LF (GAUZE/BANDAGES/DRESSINGS) ×2 IMPLANT
BNDG ESMARCH 4 X 12 STRL LF (GAUZE/BANDAGES/DRESSINGS) ×1
BNDG ESMARCH 4X12 STRL LF (GAUZE/BANDAGES/DRESSINGS) ×2 IMPLANT
BNDG GAUZE DERMACEA FLUFF 4 (GAUZE/BANDAGES/DRESSINGS) ×2 IMPLANT
BNDG GZE DERMACEA 4 6PLY (GAUZE/BANDAGES/DRESSINGS) ×1
BNDG STRETCH 4X75 STRL LF (GAUZE/BANDAGES/DRESSINGS) ×2 IMPLANT
BOOT STEPPER DURA MED (SOFTGOODS) IMPLANT
CANISTER SUCT 1200ML W/VALVE (MISCELLANEOUS) ×2 IMPLANT
COVER LIGHT HANDLE UNIVERSAL (MISCELLANEOUS) ×4 IMPLANT
DRAPE FLUOR MINI C-ARM 54X84 (DRAPES) ×2 IMPLANT
DURAPREP 26ML APPLICATOR (WOUND CARE) ×2 IMPLANT
ELECT REM PT RETURN 9FT ADLT (ELECTROSURGICAL) ×1
ELECTRODE REM PT RTRN 9FT ADLT (ELECTROSURGICAL) ×2 IMPLANT
GAUZE SPONGE 4X4 12PLY STRL (GAUZE/BANDAGES/DRESSINGS) ×2 IMPLANT
GAUZE XEROFORM 1X8 LF (GAUZE/BANDAGES/DRESSINGS) ×2 IMPLANT
GLOVE SRG 8 PF TXTR STRL LF DI (GLOVE) ×4 IMPLANT
GLOVE SURG ENC MOIS LTX SZ7.5 (GLOVE) ×4 IMPLANT
GLOVE SURG UNDER POLY LF SZ8 (GLOVE) ×2
GOWN STRL REUS W/ TWL LRG LVL3 (GOWN DISPOSABLE) ×4 IMPLANT
GOWN STRL REUS W/TWL LRG LVL3 (GOWN DISPOSABLE) ×2
KIT TURNOVER KIT A (KITS) ×2 IMPLANT
LAPIPLASTY SYS 4A (Orthopedic Implant) ×1 IMPLANT
NS IRRIG 500ML POUR BTL (IV SOLUTION) ×2 IMPLANT
PACK EXTREMITY ARMC (MISCELLANEOUS) ×2 IMPLANT
SCREW 2.7 HIGH PITCH LOCKING (Screw) IMPLANT
SCREW HIGH PITCH LOCK 2.7 (Screw) IMPLANT
STOCKINETTE IMPERVIOUS LG (DRAPES) ×2 IMPLANT
STRIP CLOSURE SKIN 1/4X4 (GAUZE/BANDAGES/DRESSINGS) ×2 IMPLANT
SUT ETHILON 3-0 (SUTURE) IMPLANT
SUT MNCRL 4-0 (SUTURE) ×1
SUT MNCRL 4-0 27XMFL (SUTURE) ×1
SUT VIC AB 3-0 SH 27 (SUTURE) ×1
SUT VIC AB 3-0 SH 27X BRD (SUTURE) IMPLANT
SUT VIC AB 4-0 SH 27 (SUTURE) ×1
SUT VIC AB 4-0 SH 27XANBCTRL (SUTURE) IMPLANT
SUTURE MNCRL 4-0 27XMF (SUTURE) ×2 IMPLANT
SYSTEM LAPIPLASTY 4A (Orthopedic Implant) IMPLANT

## 2022-12-18 NOTE — Anesthesia Preprocedure Evaluation (Signed)
Anesthesia Evaluation  Patient identified by MRN, date of birth, ID band Patient awake    Reviewed: Allergy & Precautions, NPO status , Patient's Chart, lab work & pertinent test results  History of Anesthesia Complications (+) PONV and history of anesthetic complications  Airway Mallampati: III  TM Distance: <3 FB Neck ROM: full    Dental  (+) Chipped   Pulmonary neg pulmonary ROS, neg shortness of breath   Pulmonary exam normal        Cardiovascular Exercise Tolerance: Good hypertension, (-) angina Normal cardiovascular exam     Neuro/Psych negative neurological ROS  negative psych ROS   GI/Hepatic negative GI ROS, Neg liver ROS,neg GERD  ,,  Endo/Other  negative endocrine ROS    Renal/GU      Musculoskeletal   Abdominal   Peds  Hematology negative hematology ROS (+)   Anesthesia Other Findings Past Medical History: No date: Anemia, unspecified     Comment:  due to menorrhagia No date: DDD (degenerative disc disease), lumbosacral     Comment:  MRI lumbar 2003 No date: Hyperlipidemia No date: Hypertension No date: PONV (postoperative nausea and vomiting) No date: PSVT (paroxysmal supraventricular tachycardia) 09/13/2020: Squamous cell carcinoma of skin     Comment:  left lat zygoma adjacent to scar, Moh's 10/24/20  Past Surgical History: 1997: BUNIONECTOMY     Comment:  right 1992: PARTIAL HYSTERECTOMY     Comment:  uterine fibroids 1959: TONSILLECTOMY  BMI    Body Mass Index: 23.16 kg/m      Reproductive/Obstetrics negative OB ROS                             Anesthesia Physical Anesthesia Plan  ASA: 2  Anesthesia Plan: General LMA   Post-op Pain Management:    Induction: Intravenous  PONV Risk Score and Plan: Dexamethasone, Ondansetron, Midazolam, Treatment may vary due to age or medical condition, TIVA and Propofol infusion  Airway Management Planned:  LMA  Additional Equipment:   Intra-op Plan:   Post-operative Plan: Extubation in OR  Informed Consent: I have reviewed the patients History and Physical, chart, labs and discussed the procedure including the risks, benefits and alternatives for the proposed anesthesia with the patient or authorized representative who has indicated his/her understanding and acceptance.     Dental Advisory Given  Plan Discussed with: Anesthesiologist, CRNA and Surgeon  Anesthesia Plan Comments: (Patient consented for risks of anesthesia including but not limited to:  - adverse reactions to medications - damage to eyes, teeth, lips or other oral mucosa - nerve damage due to positioning  - sore throat or hoarseness - Damage to heart, brain, nerves, lungs, other parts of body or loss of life  Patient voiced understanding.)       Anesthesia Quick Evaluation

## 2022-12-18 NOTE — Anesthesia Procedure Notes (Deleted)
Procedure Name: LMA Insertion Date/Time: 12/18/2022 12:13 PM  Performed by: Londell Moh, CRNAPre-anesthesia Checklist: Patient identified, Emergency Drugs available, Suction available, Timeout performed and Patient being monitored Patient Re-evaluated:Patient Re-evaluated prior to induction Oxygen Delivery Method: Circle system utilized Preoxygenation: Pre-oxygenation with 100% oxygen Induction Type: IV induction LMA: LMA inserted LMA Size: 4.0 Number of attempts: 1 Placement Confirmation: positive ETCO2 and breath sounds checked- equal and bilateral Tube secured with: Tape

## 2022-12-18 NOTE — Anesthesia Postprocedure Evaluation (Signed)
Anesthesia Post Note  Patient: Porshia Leatherman  Procedure(s) Performed: BUNIONECTOMY (Left: Toe)  Patient location during evaluation: PACU Anesthesia Type: General Level of consciousness: awake and alert Pain management: pain level controlled Vital Signs Assessment: post-procedure vital signs reviewed and stable Respiratory status: spontaneous breathing, nonlabored ventilation, respiratory function stable and patient connected to nasal cannula oxygen Cardiovascular status: blood pressure returned to baseline and stable Postop Assessment: no apparent nausea or vomiting Anesthetic complications: no   No notable events documented.   Last Vitals:  Vitals:   12/18/22 1415 12/18/22 1430  BP: 121/69 119/63  Pulse: 66 77  Resp: 15 13  Temp: 36.4 C 36.4 C  SpO2: 99% 100%    Last Pain:  Vitals:   12/18/22 1430  TempSrc:   PainSc: 0-No pain                 Precious Haws Moishy Laday

## 2022-12-18 NOTE — Anesthesia Procedure Notes (Addendum)
Procedure Name: LMA Insertion Date/Time: 12/18/2022 12:13 PM  Performed by: Londell Moh, CRNAPre-anesthesia Checklist: Patient identified, Emergency Drugs available, Suction available, Timeout performed and Patient being monitored Patient Re-evaluated:Patient Re-evaluated prior to induction Oxygen Delivery Method: Circle system utilized Preoxygenation: Pre-oxygenation with 100% oxygen Induction Type: IV induction LMA: LMA inserted LMA Size: 4.0 Number of attempts: 1 Placement Confirmation: positive ETCO2 and breath sounds checked- equal and bilateral Tube secured with: Tape

## 2022-12-18 NOTE — Transfer of Care (Signed)
Immediate Anesthesia Transfer of Care Note  Patient: Martha Hayes  Procedure(s) Performed: BUNIONECTOMY (Left: Toe)  Patient Location: PACU  Anesthesia Type: General LMA  Level of Consciousness: awake, alert  and patient cooperative  Airway and Oxygen Therapy: Patient Spontanous Breathing and Patient connected to supplemental oxygen  Post-op Assessment: Post-op Vital signs reviewed, Patient's Cardiovascular Status Stable, Respiratory Function Stable, Patent Airway and No signs of Nausea or vomiting  Post-op Vital Signs: Reviewed and stable  Complications: No notable events documented.

## 2022-12-18 NOTE — Op Note (Signed)
Operative note   Surgeon:Macala Baldonado Lawyer: None    Preop diagnosis: Hallux valgus deformity left foot    Postop diagnosis: Same    Procedure: 1.  Lapidus hallux valgus correction left foot  2.  Intraoperative fluoroscopy without assistance of radiologist      EBL: Minimal    Anesthesia:local with general.  Local consisted of a one-to-one mixture of 0.25% bupivacaine plain and Exparel long-acting anesthetic.     Hemostasis: [place] tourniquet inflated to 200 mmHg for 77 minutes    Specimen: None    Complications: None    Operative indications:Reynalda Carlisle is an 72 y.o. that presents today for surgical intervention.  The risks/benefits/alternatives/complications have been discussed and consent has been given.    Procedure:  Patient was brought into the OR and placed on the operating table in thesupine position. After anesthesia was obtained theleft lower extremity was prepped and draped in usual sterile fashion.  Attention was directed to the dorsal aspect of the foot where a dorsal incision was made at the first met cuneiforms joint.  Sharp and blunt dissection was carried down to the periosteum.  Subperiosteal dissection was then undertaken.  This exposed the first met cuneiform joint.  This was then freed and loosened.  A small fulcrum was placed between the base of the first metatarsal and second metatarsal.  Next the joint positioner was placed on the medial aspect of the metatarsal.  A small stab incision was made at the second metatarsal.  Compression was placed for the positioner.  Good realignment of the first intermetatarsal angle was noted at this time.  Attention was then directed to the dorsomedial first MTPJ where an incision was performed.  Sharp and blunt dissection was carried down to the capsule.  The intermetatarsal space was then entered.  The conjoined tendon of the abductor was then freed from the base of the proximal phalanx.  Attention was to redirected to  the first met cuneiform joint.  At this time the osteotomy cut guide was placed into the joint region.  2 vertical cuts were then made.  The cartilage material was removed from the first met cuneiform joint and the joint was then prepped with a 2.0 mm drill bit.  The joint compressor was then placed.  Good compression and realignment was noted.  Next the medial and dorsal locking plates were then placed from the [place] set.  Realignment and stability was noted in all planes.  It was noted under fluoroscopy both pre and post realignment.  Attention was redirected to the dorsomedial first MTPJ.  A small T capsulotomy was performed.  The dorsomedial eminence was noted and transected and smoothed with a power rasp.  A small capsulorrhaphy was performed.  Closure was then performed after all areas were irrigated.  3-0 Vicryl for the capsular tissue.  4-0 Vicryl in subcutaneous tissue and a 4-0 Monocryl for the skin.   Further local anesthesia was performed at the end of the case.    Patient tolerated the procedure and anesthesia well.  Was transported from the OR to the PACU with all vital signs stable and vascular status intact. To be discharged per routine protocol.  Will follow up in approximately 1 week in the outpatient clinic.

## 2022-12-18 NOTE — H&P (Signed)
HISTORY AND PHYSICAL INTERVAL NOTE:  12/18/2022  12:01 PM  Martha Hayes  has presented today for surgery, with the diagnosis of bunion.  The various methods of treatment have been discussed with the patient.  No guarantees were given.  After consideration of risks, benefits and other options for treatment, the patient has consented to surgery.  I have reviewed the patients' chart and labs.     A history and physical examination was performed in my office.  The patient was reexamined.  There have been no changes to this history and physical examination.  Samara Deist A

## 2022-12-19 ENCOUNTER — Encounter: Payer: Self-pay | Admitting: Podiatry

## 2022-12-24 DIAGNOSIS — M2012 Hallux valgus (acquired), left foot: Secondary | ICD-10-CM | POA: Diagnosis not present

## 2022-12-24 DIAGNOSIS — M79672 Pain in left foot: Secondary | ICD-10-CM | POA: Diagnosis not present

## 2023-01-03 ENCOUNTER — Telehealth: Payer: Self-pay | Admitting: Family Medicine

## 2023-01-03 ENCOUNTER — Other Ambulatory Visit: Payer: Self-pay | Admitting: Family Medicine

## 2023-01-03 DIAGNOSIS — E7800 Pure hypercholesterolemia, unspecified: Secondary | ICD-10-CM

## 2023-01-03 NOTE — Telephone Encounter (Signed)
Pt called stating she needed to schedule an lab appt for lipid panels. Didn't see any orders submitted for pt. Can orders be submitted? Call back # [phone]

## 2023-01-03 NOTE — Telephone Encounter (Signed)
Spoke with Jeani Hawking and schedule a lab appointment for 01/08/2023 at 8:30 am.  Future orders in Epic.

## 2023-01-03 NOTE — Addendum Note (Signed)
Addended by: Carter Kitten on: 01/03/2023 02:35 PM   Modules accepted: Orders

## 2023-01-08 ENCOUNTER — Other Ambulatory Visit (INDEPENDENT_AMBULATORY_CARE_PROVIDER_SITE_OTHER): Payer: Medicare HMO

## 2023-01-08 DIAGNOSIS — E7800 Pure hypercholesterolemia, unspecified: Secondary | ICD-10-CM | POA: Diagnosis not present

## 2023-01-08 LAB — COMPREHENSIVE METABOLIC PANEL
ALT: 18 U/L (ref 0–35)
AST: 16 U/L (ref 0–37)
Albumin: 4.1 g/dL (ref 3.5–5.2)
Alkaline Phosphatase: 97 U/L (ref 39–117)
BUN: 15 mg/dL (ref 6–23)
CO2: 27 mEq/L (ref 19–32)
Calcium: 9.3 mg/dL (ref 8.4–10.5)
Chloride: 104 mEq/L (ref 96–112)
Creatinine, Ser: 0.72 mg/dL (ref 0.40–1.20)
GFR: 83.77 mL/min (ref >60.00)
Glucose, Bld: 99 mg/dL (ref 70–99)
Potassium: 4.4 mEq/L (ref 3.5–5.1)
Sodium: 137 mEq/L (ref 135–145)
Total Bilirubin: 0.6 mg/dL (ref 0.2–1.2)
Total Protein: 6.9 g/dL (ref 6.0–8.3)

## 2023-01-08 LAB — LIPID PANEL
Cholesterol: 139 mg/dL (ref 0–200)
HDL: 43.2 mg/dL (ref >39.00)
LDL Cholesterol: 63 mg/dL (ref 0–99)
NonHDL: 96.24
Total CHOL/HDL Ratio: 3
Triglycerides: 166 mg/dL — ABNORMAL HIGH (ref 0.0–149.0)
VLDL: 33.2 mg/dL (ref 0.0–40.0)

## 2023-01-08 NOTE — Progress Notes (Signed)
No critical labs need to be addressed urgently. We will discuss labs in detail at upcoming office visit.   

## 2023-01-16 ENCOUNTER — Encounter: Payer: Medicare HMO | Admitting: Dermatology

## 2023-01-28 DIAGNOSIS — M79672 Pain in left foot: Secondary | ICD-10-CM | POA: Diagnosis not present

## 2023-01-28 DIAGNOSIS — M2012 Hallux valgus (acquired), left foot: Secondary | ICD-10-CM | POA: Diagnosis not present

## 2023-01-30 ENCOUNTER — Encounter: Payer: Self-pay | Admitting: Dermatology

## 2023-01-30 ENCOUNTER — Ambulatory Visit (INDEPENDENT_AMBULATORY_CARE_PROVIDER_SITE_OTHER): Payer: Medicare HMO | Admitting: Dermatology

## 2023-01-30 VITALS — BP 142/75 | HR 75

## 2023-01-30 DIAGNOSIS — D229 Melanocytic nevi, unspecified: Secondary | ICD-10-CM

## 2023-01-30 DIAGNOSIS — L821 Other seborrheic keratosis: Secondary | ICD-10-CM | POA: Diagnosis not present

## 2023-01-30 DIAGNOSIS — L578 Other skin changes due to chronic exposure to nonionizing radiation: Secondary | ICD-10-CM | POA: Diagnosis not present

## 2023-01-30 DIAGNOSIS — L570 Actinic keratosis: Secondary | ICD-10-CM | POA: Diagnosis not present

## 2023-01-30 DIAGNOSIS — Z1283 Encounter for screening for malignant neoplasm of skin: Secondary | ICD-10-CM | POA: Diagnosis not present

## 2023-01-30 DIAGNOSIS — L814 Other melanin hyperpigmentation: Secondary | ICD-10-CM

## 2023-01-30 NOTE — Patient Instructions (Addendum)
Melanoma ABCDEs  Melanoma is the most dangerous type of skin cancer, and is the leading cause of death from skin disease.  You are more likely to develop melanoma if you: Have light-colored skin, light-colored eyes, or red or blond hair Spend a lot of time in the sun Tan regularly, either outdoors or in a tanning bed Have had blistering sunburns, especially during childhood Have a close family member who has had a melanoma Have atypical moles or large birthmarks  Early detection of melanoma is key since treatment is typically straightforward and cure rates are extremely high if we catch it early.   The first sign of melanoma is often a change in a mole or a new dark spot.  The ABCDE system is a way of remembering the signs of melanoma.  A for asymmetry:  The two halves do not match. B for border:  The edges of the growth are irregular. C for color:  A mixture of colors are present instead of an even brown color. D for diameter:  Melanomas are usually (but not always) greater than 6mm - the size of a pencil eraser. E for evolution:  The spot keeps changing in size, shape, and color.  Please check your skin once per month between visits. You can use a small mirror in front and a large mirror behind you to keep an eye on the back side or your body.   If you see any new or changing lesions before your next follow-up, please call to schedule a visit.  Please continue daily skin protection including broad spectrum sunscreen SPF 30+ to sun-exposed areas, reapplying every 2 hours as needed when you're outdoors.   Staying in the shade or wearing long sleeves, sun glasses (UVA+UVB protection) and wide brim hats (4-inch brim around the entire circumference of the hat) are also recommended for sun protection.     Cryotherapy Aftercare  Wash gently with soap and water everyday.   Apply Vaseline daily until healed.    Recommend daily broad spectrum sunscreen SPF 30+ to sun-exposed areas, reapply  every 2 hours as needed. Call for new or changing lesions.  Staying in the shade or wearing long sleeves, sun glasses (UVA+UVB protection) and wide brim hats (4-inch brim around the entire circumference of the hat) are also recommended for sun protection.    Recommend taking Heliocare sun protection supplement daily in sunny weather for additional sun protection. For maximum protection on the sunniest days, you can take up to 2 capsules of regular Heliocare OR take 1 capsule of Heliocare Ultra. For prolonged exposure (such as a full day in the sun), you can repeat your dose of the supplement 4 hours after your first dose. Heliocare can be purchased at [employer], at some [facility] or at [email or web address].     Recommend Niacinamide or Nicotinamide 500mg  twice per day to lower risk of non-melanoma skin cancer by approximately 25%. This is usually available at [employer].    Basic OTC daily skin care regimen to prevent photoaging:   Recommend facial moisturizer with sunscreen SPF 30 every morning (OTC brands include CeraVe AM, Neutrogena, Eucerin, Cetaphil, Aveeno, La Roche Posay).  Can also apply a topical Vit C serum which is an antioxidant (OTC brands include CeraVe, La Roche Posay, and The Ordinary) underneath sunscreen in morning. If you are outside during the day in the summer for extended periods, especially swimming and/or sweating, make sure you apply a water resistant facial sunscreen lotion spf 30 or  higher.   At night recommend a cream with retinol (a vitamin A derivative which stimulates collagen production) like CeraVe skin renewing retinol serum or ROC retinol correxion cream or Neutrogena rapid wrinkle repair cream. Retinol may cause skin irritation in people with sensitive skin.  Can use it every other day and/or apply on top of a hyaluronic acid (HA) moisturizer/serum (Neutrogena Hydroboost water cream) if better tolerated that way.  Retinol may also help with lightening  brown spots.   Our office sells high quality, medically tested skin care lines such as Elta MD sunscreens (with Zinc), and Alastin skin care products, which are very effective in treating photoaging. The Alastin line includes cosmeceutical grade Vit.C serum, HA serum, Elastin stimulating moisturizers/serums, lightening serum, and sunscreens.  If you want prescription treatment, then you would need an appointment (Rx tretinoin and fade creams, Botox, filler injections, laser treatments, etc.) These prescriptions and procedures are not covered by insurance but work very well.   Gentle Skin Care Guide  1. Bathe no more than once a day.  2. Avoid bathing in hot water  3. Use a mild soap like Dove, Vanicream, Cetaphil, CeraVe. Can use Lever 2000 or Cetaphil antibacterial soap  4. Use soap only where you need it. On most days, use it under your arms, between your legs, and on your feet. Let the water rinse other areas unless visibly dirty.  5. When you get out of the bath/shower, use a towel to gently blot your skin dry, don't rub it.  6. While your skin is still a little damp, apply a moisturizing cream such as Vanicream, CeraVe, Cetaphil, Eucerin, Sarna lotion or plain Vaseline Jelly. For hands apply Neutrogena [place] Hand Cream or Excipial Hand Cream.  7. Reapply moisturizer any time you start to itch or feel dry.  8. Sometimes using free and clear laundry detergents can be helpful. Fabric softener sheets should be avoided. Downy Free & Gentle liquid, or any liquid fabric softener that is free of dyes and perfumes, it acceptable to use  9. If your doctor has given you prescription creams you may apply moisturizers over them     Due to recent changes in healthcare laws, you may see results of your pathology and/or laboratory studies on MyChart before the doctors have had a chance to review them. We understand that in some cases there may be results that are confusing or concerning to you.  Please understand that not all results are received at the same time and often the doctors may need to interpret multiple results in order to provide you with the best plan of care or course of treatment. Therefore, we ask that you please give us 2 business days to thoroughly review all your results before contacting the office for clarification. Should we see a critical lab result, you will be contacted sooner.   If You Need Anything After Your Visit  If you have any questions or concerns for your doctor, please call our main line at [phone] and press option 4 to reach your doctor's medical assistant. If no one answers, please leave a voicemail as directed and we will return your call as soon as possible. Messages left after 4 pm will be answered the following business day.   You may also send us a message via MyChart. We typically respond to MyChart messages within 1-2 business days.  For prescription refills, please ask your pharmacy to contact our office. Our fax number is [phone].  If you have an  urgent issue when the clinic is closed that cannot wait until the next business day, you can page your doctor at the number below.    Please note that while we do our best to be available for urgent issues outside of office hours, we are not available 24/7.   If you have an urgent issue and are unable to reach [place], you may choose to seek medical care at your doctor's office, retail clinic, urgent care center, or emergency room.  If you have a medical emergency, please immediately call 911 or go to the emergency department.  Pager Numbers  - Dr. Gwen Pounds: [phone]  - Dr. Neale Burly: [phone]  - Dr. Roseanne Reno: [phone]  In the event of inclement weather, please call our main line at [phone] for an update on the status of any delays or closures.  Dermatology Medication Tips: Please keep the boxes that topical medications come in in order to help keep track of the  instructions about where and how to use these. Pharmacies typically print the medication instructions only on the boxes and not directly on the medication tubes.   If your medication is too expensive, please contact our office at [phone] option 4 or send [place] a message through MyChart.   We are unable to tell what your co-pay for medications will be in advance as this is different depending on your insurance coverage. However, we may be able to find a substitute medication at lower cost or fill out paperwork to get insurance to cover a needed medication.   If a prior authorization is required to get your medication covered by your insurance company, please allow [place] 1-2 business days to complete this process.  Drug prices often vary depending on where the prescription is filled and some pharmacies may offer cheaper prices.  The website www.goodrx.com contains coupons for medications through different pharmacies. The prices here do not account for what the cost may be with help from insurance (it may be cheaper with your insurance), but the website can give you the price if you did not use any insurance.  - You can print the associated coupon and take it with your prescription to the pharmacy.  - You may also stop by our office during regular business hours and pick up a GoodRx coupon card.  - If you need your prescription sent electronically to a different pharmacy, notify our office through [facility] or by phone at [phone] option 4.     Si Usted Necesita Algo Despus de Su Visita  Tambin puede enviarnos un mensaje a travs de Clinical cytogeneticist. Por lo general respondemos a los mensajes de MyChart en el transcurso de 1 a 2 das hbiles.  Para renovar recetas, por favor pida a su farmacia que se ponga en contacto con nuestra oficina. Annie Sable de fax es [place] [phone].  Si tiene un asunto urgente cuando la clnica est cerrada y que no puede esperar hasta el siguiente da hbil,  puede llamar/localizar a su doctor(a) al nmero que aparece a continuacin.   Por favor, tenga en cuenta que aunque hacemos todo lo posible para estar disponibles para asuntos urgentes fuera del horario de [place], no estamos disponibles las 24 horas del da, los 7 [address]  [address] de la [place].   Si tiene un problema urgente y no puede comunicarse con nosotros, puede optar por buscar atencin mdica  en el consultorio de su doctor(a), en una clnica privada, en un centro de atencin urgente o en una sala de emergencias.  Si tiene Engineer, drilling, por favor llame inmediatamente al 911 o vaya a la sala de emergencias.  Nmeros de bper  - Dr. Gwen Pounds: [phone]  - Dra. Moye: [phone]  - Dra. Roseanne Reno: [phone]  En caso de inclemencias del [place], por favor llame a Lacy Duverney principal al [phone] para una actualizacin sobre el [place] de cualquier retraso o cierre.  Consejos para la medicacin en dermatologa: Por favor, guarde las cajas en las que vienen los medicamentos de uso tpico para ayudarle a seguir las instrucciones sobre dnde y cmo usarlos. Las farmacias generalmente imprimen las instrucciones del medicamento slo en las cajas y no directamente en los tubos del [place].   Si su medicamento es muy caro, por favor, pngase en contacto con Rolm Gala llamando al [phone] y presione la opcin 4 o envenos un mensaje a travs de Clinical cytogeneticist.   No podemos decirle cul ser su copago por los medicamentos por adelantado ya que esto es diferente dependiendo de la cobertura de su seguro. Sin embargo, es posible que podamos encontrar un medicamento sustituto a Audiological scientist un formulario para que el seguro cubra el medicamento que se considera necesario.   Si se requiere una autorizacin previa para que su compaa de seguros [place] su medicamento, por favor permtanos de 1 a 2 das hbiles para completar [address]  Los precios de los medicamentos varan con  frecuencia dependiendo del Environmental consultant de dnde se surte la receta y alguna farmacias pueden ofrecer precios ms baratos.  El sitio web www.goodrx.com tiene cupones para medicamentos de Health and safety inspector. Los precios aqu no tienen en cuenta lo que podra costar con la ayuda del seguro (puede ser ms barato con su seguro), pero el sitio web puede darle el precio si no utiliz Tourist information centre manager.  - Puede imprimir el cupn correspondiente y llevarlo con su receta a la farmacia.  - Tambin puede pasar por nuestra oficina durante el horario de atencin regular y Education officer, museum una tarjeta de cupones de GoodRx.  - Si necesita que su receta se enve electrnicamente a una farmacia diferente, informe a nuestra oficina a travs de MyChart de [place] o por telfono llamando al [phone] y presione la opcin 4.

## 2023-01-30 NOTE — Progress Notes (Signed)
Follow-Up Visit   Subjective  Martha Hayes is a 72 y.o. female who presents for the following: Skin Cancer Screening and Full Body Skin Exam. HxSCC. HxAKs. S/P 5FU/Calcip. treatment since last visit on face  The patient presents for Total-Body Skin Exam (TBSE) for skin cancer screening and mole check. The patient has spots, moles and lesions to be evaluated, some may be new or changing and the patient has concerns that these could be cancer.    The following portions of the chart were reviewed this encounter and updated as appropriate: medications, allergies, medical history  Review of Systems:  No other skin or systemic complaints except as noted in HPI or Assessment and Plan.  Objective  Well appearing patient in no apparent distress; mood and affect are within normal limits.  A full examination was performed including scalp, head, eyes, ears, nose, lips, neck, chest, axillae, abdomen, back, buttocks, bilateral upper extremities, bilateral lower extremities, hands, feet, fingers, toes, fingernails, and toenails. All findings within normal limits unless otherwise noted below.   Relevant physical exam findings are noted in the Assessment and Plan.    Assessment & Plan   LENTIGINES, SEBORRHEIC KERATOSES, HEMANGIOMAS - Benign normal skin lesions - Benign-appearing - Call for any changes  MELANOCYTIC NEVI - Tan-brown and/or pink-flesh-colored symmetric macules and papules - Benign appearing on exam today - Observation - Call clinic for new or changing moles - Recommend daily use of broad spectrum spf 30+ sunscreen to sun-exposed areas.   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  - Chronic condition, secondary to cumulative UV/sun exposure - diffuse scaly erythematous macules with underlying dyspigmentation - Recommend daily broad spectrum sunscreen SPF 30+ to sun-exposed areas, reapply every 2 hours as needed.  - Staying in the shade or  wearing long sleeves, sun glasses (UVA+UVB protection) and wide brim hats (4-inch brim around the entire circumference of the hat) are also recommended for sun protection.  - Call for new or changing lesions.  HISTORY OF SQUAMOUS CELL CARCINOMA OF THE SKIN left lateral zygoma adjacent to scar 2022 Moh's 10/24/20 - No evidence of recurrence today - No lymphadenopathy - Recommend regular full body skin exams - Recommend daily broad spectrum sunscreen SPF 30+ to sun-exposed areas, reapply every 2 hours as needed.  - Call if any new or changing lesions are noted between office visits  Pilar Cyst Exam: Subcutaneous nodule at scalp.  Benign-appearing. Exam most consistent with a pilar cyst. Discussed that a cyst is a benign growth that can grow over time and sometimes get irritated or inflamed. Recommend observation if it is not bothersome. Discussed option of surgical excision to remove it if it is growing, symptomatic, or other changes noted. Please call for new or changing lesions so they can be evaluated.   SKIN CANCER SCREENING PERFORMED TODAY.  ACTINIC KERATOSIS Exam: Erythematous thin papules/macules with gritty scale  Actinic keratoses are precancerous spots that appear secondary to cumulative UV radiation exposure/sun exposure over time. They are chronic with expected duration over 1 year. A portion of actinic keratoses will progress to squamous cell carcinoma of the skin. It is not possible to reliably predict which spots will progress to skin cancer and so treatment is recommended to prevent development of skin cancer.  Recommend daily broad spectrum sunscreen SPF 30+ to sun-exposed areas, reapply every 2 hours as needed.  Recommend staying in the shade or wearing long sleeves, sun glasses (UVA+UVB protection) and wide brim hats (4-inch brim around the  entire circumference of the hat). Call for new or changing lesions.  Treatment Plan:  Prior to procedure, discussed risks of blister  formation, small wound, skin dyspigmentation, or rare scar following cryotherapy. Recommend Vaseline ointment to treated areas while healing.  Destruction Procedure Note Destruction method: cryotherapy   Informed consent: discussed and consent obtained   Lesion destroyed using liquid nitrogen: Yes   Outcome: patient tolerated procedure well with no complications   Post-procedure details: wound care instructions given   Locations: right helix x1, left cheek x1 # of Lesions Treated: 2   Return for TBSE in 6-12 months.  I, Lawson Radar, CMA, am acting as scribe for Darden Dates, MD.    Documentation: I have reviewed the above documentation for accuracy and completeness, and I agree with the above.  Darden Dates, MD

## 2023-03-11 DIAGNOSIS — M2012 Hallux valgus (acquired), left foot: Secondary | ICD-10-CM | POA: Diagnosis not present

## 2023-04-14 DIAGNOSIS — M222X1 Patellofemoral disorders, right knee: Secondary | ICD-10-CM | POA: Diagnosis not present

## 2023-04-21 ENCOUNTER — Other Ambulatory Visit: Payer: Self-pay | Admitting: Family Medicine

## 2023-04-21 DIAGNOSIS — Z1231 Encounter for screening mammogram for malignant neoplasm of breast: Secondary | ICD-10-CM

## 2023-05-13 ENCOUNTER — Telehealth: Payer: Self-pay

## 2023-05-13 NOTE — Telephone Encounter (Signed)
Reached out and scheduled patient for her physician in December. Elijio Miles [facility] Health Specialist

## 2023-05-19 DIAGNOSIS — M1711 Unilateral primary osteoarthritis, right knee: Secondary | ICD-10-CM | POA: Diagnosis not present

## 2023-05-22 DIAGNOSIS — R262 Difficulty in walking, not elsewhere classified: Secondary | ICD-10-CM | POA: Diagnosis not present

## 2023-05-22 DIAGNOSIS — M25561 Pain in right knee: Secondary | ICD-10-CM | POA: Diagnosis not present

## 2023-05-27 ENCOUNTER — Ambulatory Visit: Payer: Medicare HMO | Admitting: Family Medicine

## 2023-06-09 ENCOUNTER — Ambulatory Visit: Payer: Medicare HMO

## 2023-06-11 ENCOUNTER — Ambulatory Visit
Admission: RE | Admit: 2023-06-11 | Discharge: 2023-06-11 | Disposition: A | Discharge status: Home / Self Care | Payer: Medicare HMO | Source: Ambulatory Visit | Attending: Family Medicine | Admitting: Family Medicine

## 2023-06-11 DIAGNOSIS — Z1231 Encounter for screening mammogram for malignant neoplasm of breast: Secondary | ICD-10-CM | POA: Diagnosis not present

## 2023-06-15 ENCOUNTER — Encounter: Payer: Self-pay | Admitting: Family Medicine

## 2023-06-18 ENCOUNTER — Other Ambulatory Visit: Payer: Self-pay | Admitting: Family Medicine

## 2023-06-18 MED ORDER — SIMVASTATIN 40 MG PO TABS: 40.0000 mg | ORAL_TABLET | Freq: Every day | ORAL | 11 refills | Status: DC

## 2023-06-25 DIAGNOSIS — M1711 Unilateral primary osteoarthritis, right knee: Secondary | ICD-10-CM | POA: Diagnosis not present

## 2023-07-24 ENCOUNTER — Telehealth: Payer: Self-pay

## 2023-07-24 NOTE — Patient Outreach (Signed)
  Care Coordination   Initial Visit Note   07/24/2023 Name: Martha Hayes MRN: [id number] DOB: 12-18-1950  Martha Hayes is a 72 y.o. year old female who sees Martha Seltzer, MD for primary care. I spoke with  Martha Hayes by phone today.  What matters to the patients health and wellness today?  Patient denies having any nursing and/ or community resource needs at this time.     Goals Addressed             This Visit's Progress    COMPLETED: Care coordination activities - no follow up needed       Interventions Today    Flowsheet Row Most Recent Value  General Interventions   General Interventions Discussed/Reviewed General Interventions Discussed, Vaccines  [Care coordination services discussed. SDOH survey completed. AWV discussed and patient advised to contact provider office to schedule. Discussed vaccines. Advised to contact PCP office if care coordination services needed in the future.]  Vaccines Flu, Pneumonia, RSV              SDOH assessments and interventions completed:  Yes  SDOH Interventions Today    Flowsheet Row Most Recent Value  SDOH Interventions   Food Insecurity Interventions Intervention Not Indicated  Housing Interventions Intervention Not Indicated  Transportation Interventions Intervention Not Indicated        Care Coordination Interventions:  Yes, provided   Follow up plan: No further intervention required.   Encounter Outcome:  Patient Visit Completed   Martha Hayes [facility] Care Coordination [phone] direct line

## 2023-08-08 ENCOUNTER — Ambulatory Visit (INDEPENDENT_AMBULATORY_CARE_PROVIDER_SITE_OTHER): Payer: Medicare HMO | Admitting: *Deleted

## 2023-08-08 DIAGNOSIS — Z23 Encounter for immunization: Secondary | ICD-10-CM

## 2023-08-08 NOTE — Progress Notes (Signed)
Pt presented today for their flu shot.  Vaccine was administered in the pt's L Deltiod. Pt tolerated well with no complaints.

## 2023-08-14 ENCOUNTER — Encounter: Payer: Self-pay | Admitting: Family Medicine

## 2023-08-14 ENCOUNTER — Ambulatory Visit (INDEPENDENT_AMBULATORY_CARE_PROVIDER_SITE_OTHER): Payer: Medicare HMO | Admitting: Family Medicine

## 2023-08-14 VITALS — BP 110/62 | HR 73 | Temp 99.0°F | Ht 61.5 in | Wt 125.0 lb

## 2023-08-14 DIAGNOSIS — K130 Diseases of lips: Secondary | ICD-10-CM | POA: Diagnosis not present

## 2023-08-14 MED ORDER — MUPIROCIN 2 % EX OINT: 1.0000 | TOPICAL_OINTMENT | Freq: Two times a day (BID) | CUTANEOUS | 0 refills | Status: DC

## 2023-08-14 NOTE — Progress Notes (Signed)
Patient ID: Martha Hayes, female    DOB: 10/01/1951, 72 y.o.   MRN: [id number]  This visit was conducted in person.  BP 110/62 (BP Location: Left Arm, Patient Position: Sitting, Cuff Size: Normal)   Pulse 73   Temp 99 F (37.2 C) (Temporal)   Ht 5' 1.5" (1.562 m)   Wt 125 lb (56.7 kg)   SpO2 99%   BMI 23.24 kg/m    CC:  Chief Complaint  Patient presents with   Mouth Lesions    Sore on corner of mouth x 1 week    Subjective:   HPI: Martha Hayes is a 72 y.o. female presenting on 08/14/2023 for Mouth Lesions (Sore on corner of mouth x 1 week)    New onset sore in corner of mouth x 1 week.  Has history of cracks in corners of mouth.. started topical nystatin three ytimes daily.  Are worsened... increase deepen more red, crusting with yellowish substance.   Now using Aquaphor.       Relevant past medical, surgical, family and social history reviewed and updated as indicated. Interim medical history since our last visit reviewed. Allergies and medications reviewed and updated. Outpatient Medications Prior to Visit  Medication Sig Dispense Refill   carvedilol (COREG) 6.25 MG tablet TAKE 1 TABLET BY MOUTH 2 TIMES DAILY WITH A MEAL. 180 tablet 3   Cholecalciferol (VITAMIN D3) 25 MCG (1000 UT) CAPS Take 1 capsule by mouth daily at 2 PM.     hydrochlorothiazide (MICROZIDE) 12.5 MG capsule TAKE 1 CAPSULE BY MOUTH EVERY DAY 90 capsule 3   lisinopril (ZESTRIL) 10 MG tablet TAKE 1 TABLET BY MOUTH EVERY DAY 90 tablet 3   simvastatin (ZOCOR) 40 MG tablet Take 1 tablet (40 mg total) by mouth at bedtime. 30 tablet 11   oxyCODONE-acetaminophen (PERCOCET) 5-325 MG tablet Take 1-2 tablets by mouth every 6 (six) hours as needed for severe pain. Max 6 tabs per day 30 tablet 0   fluorouracil (EFUDEX) 5 % cream Apply topically 2 (two) times daily. Twice daily x 7 days to affected area at left temple (Patient not taking: Reported on 09/24/2022) 40 g 0   No facility-administered medications prior to  visit.     Per HPI unless specifically indicated in ROS section below Review of Systems  Constitutional:  Negative for fatigue and fever.  HENT:  Negative for congestion.   Eyes:  Negative for pain.  Respiratory:  Negative for cough and shortness of breath.   Cardiovascular:  Negative for chest pain, palpitations and leg swelling.  Gastrointestinal:  Negative for abdominal pain.  Genitourinary:  Negative for dysuria and vaginal bleeding.  Musculoskeletal:  Negative for back pain.  Neurological:  Negative for syncope, light-headedness and headaches.  Psychiatric/Behavioral:  Negative for dysphoric mood.    Objective:  BP 110/62 (BP Location: Left Arm, Patient Position: Sitting, Cuff Size: Normal)   Pulse 73   Temp 99 F (37.2 C) (Temporal)   Ht 5' 1.5" (1.562 m)   Wt 125 lb (56.7 kg)   SpO2 99%   BMI 23.24 kg/m   Wt Readings from Last 3 Encounters:  08/14/23 125 lb (56.7 kg)  12/18/22 124 lb 9.6 oz (56.5 kg)  09/24/22 123 lb 6.4 oz (56 kg)      Physical Exam Constitutional:      General: She is not in acute distress.    Appearance: Normal appearance. She is well-developed. She is not ill-appearing or toxic-appearing.  HENT:  Head: Normocephalic.     Comments: Cracking and erythema at the left lip is much with small amount of clear discharge    Right Ear: Hearing, tympanic membrane, ear canal and external ear normal. Tympanic membrane is not erythematous, retracted or bulging.     Left Ear: Hearing, tympanic membrane, ear canal and external ear normal. Tympanic membrane is not erythematous, retracted or bulging.     Nose: No mucosal edema or rhinorrhea.     Right Sinus: No maxillary sinus tenderness or frontal sinus tenderness.     Left Sinus: No maxillary sinus tenderness or frontal sinus tenderness.     Mouth/Throat:     Mouth: Oropharynx is clear and moist and mucous membranes are normal.     Pharynx: Uvula midline.  Eyes:     General: Lids are normal. Lids are  everted, no foreign bodies appreciated.     Extraocular Movements: EOM normal.     Conjunctiva/sclera: Conjunctivae normal.     Pupils: Pupils are equal, round, and reactive to light.  Neck:     Thyroid: No thyroid mass or thyromegaly.     Vascular: No carotid bruit.     Trachea: Trachea normal.  Cardiovascular:     Rate and Rhythm: Normal rate and regular rhythm.     Pulses: Normal pulses.     Heart sounds: Normal heart sounds, S1 normal and S2 normal. No murmur heard.    No friction rub. No gallop.  Pulmonary:     Effort: Pulmonary effort is normal. No tachypnea or respiratory distress.     Breath sounds: Normal breath sounds. No decreased breath sounds, wheezing, rhonchi or rales.  Abdominal:     General: Bowel sounds are normal.     Palpations: Abdomen is soft.     Tenderness: There is no abdominal tenderness.  Musculoskeletal:     Cervical back: Normal range of motion and neck supple.  Skin:    General: Skin is warm, dry and intact.     Findings: No rash.  Neurological:     Mental Status: She is alert.  Psychiatric:        Mood and Affect: Mood is not anxious or depressed.        Speech: Speech normal.        Behavior: Behavior normal. Behavior is cooperative.        Thought Content: Thought content normal.        Cognition and Memory: Cognition and memory normal.        Judgment: Judgment normal.       Results for orders placed or performed in visit on 01/08/23  Lipid panel  Result Value Ref Range   Cholesterol 139 0 - 200 mg/dL   Triglycerides 301.6 (H) 0.0 - 149.0 mg/dL   HDL 01.09 >32.35 mg/dL   VLDL 57.3 0.0 - 22.0 mg/dL   LDL Cholesterol 63 0 - 99 mg/dL   Total CHOL/HDL Ratio 3    NonHDL 96.24   Comprehensive metabolic panel  Result Value Ref Range   Sodium 137 135 - 145 mEq/L   Potassium 4.4 3.5 - 5.1 mEq/L   Chloride 104 96 - 112 mEq/L   CO2 27 19 - 32 mEq/L   Glucose, Bld 99 70 - 99 mg/dL   BUN 15 6 - 23 mg/dL   Creatinine, Ser 2.54 0.40 - 1.20  mg/dL   Total Bilirubin 0.6 0.2 - 1.2 mg/dL   Alkaline Phosphatase 97 39 - 117 U/L   AST  16 0 - 37 U/L   ALT 18 0 - 35 U/L   Total Protein 6.9 6.0 - 8.3 g/dL   Albumin 4.1 3.5 - 5.2 g/dL   GFR 19.14 >78.29 mL/min   Calcium 9.3 8.4 - 10.5 mg/dL    Assessment and Plan  Angular cheilitis Assessment & Plan: Acute episode of chronic intermittent issue. No improvement with nystatin so yeast is less likely.  Now with crusting and discharge so more concern for staph infection.  Will treat with mupirocin 2% ointment twice daily. Given issue is chronic discussed briefly B12 deficiency possibility but she states the issue of irritation occurs rarely and dermatologist felt it was likely due to  prominent skin fold at edges of lips.    Other orders -     Mupirocin; Apply 1 Application topically 2 (two) times daily.  Dispense: 22 g; Refill: 0    No follow-ups on file.   Kerby Nora, MD

## 2023-08-14 NOTE — Assessment & Plan Note (Signed)
Acute episode of chronic intermittent issue. No improvement with nystatin so yeast is less likely.  Now with crusting and discharge so more concern for staph infection.  Will treat with mupirocin 2% ointment twice daily. Given issue is chronic discussed briefly B12 deficiency possibility but she states the issue of irritation occurs rarely and dermatologist felt it was likely due to  prominent skin fold at edges of lips.

## 2023-09-15 ENCOUNTER — Ambulatory Visit (INDEPENDENT_AMBULATORY_CARE_PROVIDER_SITE_OTHER): Payer: Medicare HMO

## 2023-09-15 VITALS — Ht 61.5 in | Wt 122.0 lb

## 2023-09-15 DIAGNOSIS — Z0000 Encounter for general adult medical examination without abnormal findings: Secondary | ICD-10-CM | POA: Diagnosis not present

## 2023-09-15 NOTE — Progress Notes (Signed)
Subjective:   Martha Hayes is a 72 y.o. female who presents for Medicare Annual (Subsequent) preventive examination.  Visit Complete: Virtual I connected with  Martha Hayes on 09/15/23 by a audio enabled telemedicine application and verified that I am speaking with the correct person using two identifiers.  Patient Location: Home  Provider Location: Home Office  I discussed the limitations of evaluation and management by telemedicine. The patient expressed understanding and agreed to proceed.  Vital Signs: Because this visit was a virtual/telehealth visit, some criteria may be missing or patient reported. Any vitals not documented were not able to be obtained and vitals that have been documented are patient reported.  Cardiac Risk Factors include: advanced age (>41men, >67 women);dyslipidemia;family history of premature cardiovascular disease;hypertension     Objective:    Today's Vitals   09/15/23 1303  Weight: 122 lb (55.3 kg)  Height: 5' 1.5" (1.562 m)  PainSc: 0-No pain   Body mass index is 22.68 kg/m.     09/15/2023    1:05 PM 12/18/2022   10:46 AM 09/20/2022   11:35 AM 08/05/2017    8:24 AM 07/05/2016    2:38 PM  Advanced Directives  Does Patient Have a Medical Advance Directive? Yes Yes Yes Yes Yes  Type of Estate agent of [place];Living will Healthcare Power of [place];Living will Healthcare Power of [place];Living will Healthcare Power of [place];Living will Healthcare Power of [place];Living will  Does patient want to make changes to medical advance directive?  No - Patient declined No - Patient declined  No - Patient declined  Copy of Healthcare Power of Attorney in Chart? No - copy requested No - copy requested No - copy requested No - copy requested No - copy requested    Current Medications (verified) Outpatient Encounter Medications as of 09/15/2023  Medication Sig   carvedilol (COREG) 6.25 MG tablet TAKE 1 TABLET BY MOUTH 2 TIMES  DAILY WITH A MEAL.   Cholecalciferol (VITAMIN D3) 25 MCG (1000 UT) CAPS Take 1 capsule by mouth daily at 2 PM.   hydrochlorothiazide (MICROZIDE) 12.5 MG capsule TAKE 1 CAPSULE BY MOUTH EVERY DAY   lisinopril (ZESTRIL) 10 MG tablet TAKE 1 TABLET BY MOUTH EVERY DAY   simvastatin (ZOCOR) 40 MG tablet Take 1 tablet (40 mg total) by mouth at bedtime.   mupirocin ointment (BACTROBAN) 2 % Apply 1 Application topically 2 (two) times daily. (Patient not taking: Reported on 09/15/2023)   No facility-administered encounter medications on file as of 09/15/2023.    Allergies (verified) Penicillins   History: Past Medical History:  Diagnosis Date   Anemia, unspecified    due to menorrhagia   DDD (degenerative disc disease), lumbosacral    MRI lumbar 2003   Hyperlipidemia    Hypertension    PONV (postoperative nausea and vomiting)    PSVT (paroxysmal supraventricular tachycardia) (HCC)    Squamous cell carcinoma of skin 09/13/2020   left lat zygoma adjacent to scar, Moh's 10/24/20   Past Surgical History:  Procedure Laterality Date   BUNIONECTOMY  1997   right   BUNIONECTOMY Left 12/18/2022   Procedure: BUNIONECTOMY;  Surgeon: Gwyneth Revels, DPM;  Location: [facility];  Service: Podiatry;  Laterality: Left;   PARTIAL HYSTERECTOMY  1992   uterine fibroids   TONSILLECTOMY  1959   Family History  Problem Relation Age of Onset   Diabetes Father    Hypertension Father    Seizures Father        petit mal  seizure disorder   Heart disease Father        defibrillation   Heart disease Mother        CAD, two stents   Diabetes Mother        developed in 38's   Osteoporosis Mother    Hypertension Mother    Heart disease Sister        pacemaker   Sudden Cardiac Death Sister 65       aborted; had h/o bundle branch block   Social History   Socioeconomic History   Marital status: Married    Spouse name: Not on file   Number of children: Not on file   Years of education: Not on  file   Highest education level: Not on file  Occupational History   Occupation: Charity fundraiser    Employer: [employer]    Comment: [employer]  Tobacco Use   Smoking status: Never   Smokeless tobacco: Never  Vaping Use   Vaping status: Never Used  Substance and Sexual Activity   Alcohol use: Yes    Alcohol/week: 5.0 standard drinks of alcohol    Types: 5 Glasses of wine per week    Comment: occasionally   Drug use: No   Sexual activity: Not on file  Other Topics Concern   Not on file  Social History Narrative   Not on file   Social Determinants of Health   Financial Resource Strain: Low Risk  (09/15/2023)   Overall Financial Resource Strain (CARDIA)    Difficulty of Paying Living Expenses: Not hard at all  Food Insecurity: No Food Insecurity (09/15/2023)   Hunger Vital Sign    Worried About Running Out of Food in the Last Year: Never true    Ran Out of Food in the Last Year: Never true  Transportation Needs: No Transportation Needs (09/15/2023)   PRAPARE - Administrator, Civil Service (Medical): No    Lack of Transportation (Non-Medical): No  Physical Activity: Sufficiently Active (09/15/2023)   Exercise Vital Sign    Days of Exercise per Week: 5 days    Minutes of Exercise per Session: 80 min  Stress: No Stress Concern Present (09/15/2023)   [employer] of Occupational Health - Occupational Stress Questionnaire    Feeling of Stress : Not at all  Social Connections: Unknown (09/15/2023)   Social Connection and Isolation Panel [NHANES]    Frequency of Communication with Friends and Family: Patient declined    Frequency of Social Gatherings with Friends and Family: Patient declined    Attends Religious Services: Patient declined    Database administrator or Organizations: Patient declined    Attends Engineer, structural: Patient declined    Marital Status: Married    Tobacco Counseling Counseling given: Not Answered   Clinical  Intake:  Pre-visit preparation completed: Yes  Pain : No/denies pain Pain Score: 0-No pain     BMI - recorded: 22.68 Nutritional Status: BMI of 19-24  Normal Nutritional Risks: None Diabetes: No  How often do you need to have someone help you when you read instructions, pamphlets, or other written materials from your doctor or pharmacy?: 1 - Never What is the last grade level you completed in school?: 3 YEARS [employer] GRADUATE  Interpreter Needed?: No  Information entered by :: Nelma Phagan N. Reianna Batdorf, LPN.   Activities of Daily Living    09/15/2023    1:08 PM 12/18/2022   10:35 AM  In your present  state of health, do you have any difficulty performing the following activities:  Hearing? 0 0  Vision? 0 0  Difficulty concentrating or making decisions? 0 0  Walking or climbing stairs? 0 0  Dressing or bathing? 0 0  Doing errands, shopping? 0   Preparing Food and eating ? N   Using the Toilet? N   In the past six months, have you accidently leaked urine? N   Do you have problems with loss of bowel control? N   Managing your Medications? N   Managing your Finances? N   Housekeeping or managing your Housekeeping? N     Patient Care Team: Excell Seltzer, MD as PCP - General End, Cristal Deer, MD as Consulting Physician (Cardiology) Lockie Mola, MD as Referring Physician (Ophthalmology)  Indicate any recent Medical Services you may have received from other than [facility] providers in the past year (date may be approximate).     Assessment:   This is a routine wellness examination for [employer].  Hearing/Vision screen Hearing Screening - Comments:: Denies hearing difficulties.  Vision Screening - Comments:: Patient does not wear any corrective lenses/contacts.   Eye exam done by: Deirdre Evener, MD.    Goals Addressed             This Visit's Progress    Patient Stated       No goals at this time.      Depression Screen    09/15/2023     1:08 PM 09/24/2022    8:42 AM 09/20/2022   12:04 PM 09/18/2021    9:54 AM 09/08/2020   11:14 AM 09/03/2019   10:03 AM 09/01/2018   10:19 AM  PHQ 2/9 Scores  PHQ - 2 Score 0 0 0 0 0 0 0  PHQ- 9 Score 0          Fall Risk    09/15/2023    1:08 PM 09/24/2022    8:42 AM 09/20/2022   11:35 AM 09/18/2021    9:54 AM 09/08/2020   11:14 AM  Fall Risk   Falls in the past year? 0 0 0 1 1  Number falls in past yr: 0  0 0 1  Injury with Fall? 0   0 0  Risk for fall due to : No Fall Risks  No Fall Risks    Follow up Falls prevention discussed Falls evaluation completed Falls evaluation completed;Falls prevention discussed      MEDICARE RISK AT HOME: Medicare Risk at Home Any stairs in or around the home?: Yes If so, are there any without handrails?: No Home free of loose throw rugs in walkways, pet beds, electrical cords, etc?: Yes Adequate lighting in your home to reduce risk of falls?: Yes Life alert?: No Use of a cane, walker or w/c?: No Grab bars in the bathroom?: No Shower chair or bench in shower?: No Elevated toilet seat or a handicapped toilet?: No  TIMED UP AND GO:  Was the test performed?  No    Cognitive Function:    09/15/2023    1:07 PM 08/05/2017    8:11 AM  MMSE - Mini Mental State Exam  Not completed: Unable to complete   Orientation to time  5  Orientation to Place  5  Registration  3  Attention/ Calculation  0  Recall  3  Language- name 2 objects  0  Language- repeat  1  Language- follow 3 step command  3  Language- read & follow direction  0  Write a sentence  0  Copy design  0  Total score  20        09/15/2023    1:07 PM 09/20/2022   11:38 AM  6CIT Screen  What Year? 0 points 0 points  What month? 0 points 0 points  What time? 0 points 0 points  Count back from 20 0 points 0 points  Months in reverse 0 points 0 points  Repeat phrase 0 points 0 points  Total Score 0 points 0 points    Immunizations Immunization History  Administered  Date(s) Administered   Fluad Quad(high Dose 65+) 08/12/2019, 08/16/2020, 08/08/2022   Fluad Trivalent(High Dose 65+) 08/08/2023   Influenza Whole 10/21/2005, 07/22/2007, 07/21/2009   Influenza, High Dose Seasonal PF 08/25/2018, 08/15/2021   Influenza, Seasonal, Injecte, Preservative Fre 09/05/2015, 07/29/2016   Influenza,inj,Quad PF,6+ Mos 08/05/2017   PFIZER(Purple Top)SARS-COV-2 Vaccination 11/12/2019, 12/03/2019, 09/07/2020   Pneumococcal Conjugate-13 07/29/2016   Pneumococcal Polysaccharide-23 08/05/2017   Td 12/19/2005   Zoster Recombinant(Shingrix) 04/03/2021, 07/18/2021   Zoster, Live 04/15/2011    TDAP status: Due, Education has been provided regarding the importance of this vaccine. Advised may receive this vaccine at local pharmacy or Health Dept. Aware to provide a copy of the vaccination record if obtained from local pharmacy or Health Dept. Verbalized acceptance and understanding.  Flu Vaccine status: Up to date  Pneumococcal vaccine status: Up to date  Covid-19 vaccine status: Completed vaccines  Qualifies for Shingles Vaccine? Yes   Zostavax completed Yes   Shingrix Completed?: Yes  Screening Tests Health Maintenance  Topic Date Due   DTaP/Tdap/Td (2 - Tdap) 12/20/2015   COVID-19 Vaccine (4 - 2023-24 season) 06/22/2023   MAMMOGRAM  06/10/2024   Medicare Annual Wellness (AWV)  09/14/2024   Colonoscopy  03/06/2028   Pneumonia Vaccine 41+ Years old  Completed   INFLUENZA VACCINE  Completed   DEXA SCAN  Completed   Hepatitis C Screening  Completed   Zoster Vaccines- Shingrix  Completed   HPV VACCINES  Aged Out    Health Maintenance  Health Maintenance Due  Topic Date Due   DTaP/Tdap/Td (2 - Tdap) 12/20/2015   COVID-19 Vaccine (4 - 2023-24 season) 06/22/2023    Colorectal cancer screening: Type of screening: Colonoscopy. Completed 03/06/2018. Repeat every 10 years  Mammogram status: Completed 06/11/2023. Repeat every year  Bone Density status: Patient  declined.  Lung Cancer Screening: (Low Dose CT Chest recommended if Age 66-80 years, 20 pack-year currently smoking OR have quit w/in 15years.) does not qualify.   Lung Cancer Screening Referral: no  Additional Screening:  Hepatitis C Screening: does qualify; Completed 12/15/2007  Vision Screening: Recommended annual ophthalmology exams for early detection of glaucoma and other disorders of the eye. Is the patient up to date with their annual eye exam?  Yes  Who is the provider or what is the name of the office in which the patient attends annual eye exams? Deirdre Evener, MD. If pt is not established with a provider, would they like to be referred to a provider to establish care? No .   Dental Screening: Recommended annual dental exams for proper oral hygiene  Diabetic Foot Exam: N/A  Community Resource Referral / Chronic Care Management: CRR required this visit?  No   CCM required this visit?  No     Plan:     I have personally reviewed and noted the following in the patient's chart:   Medical and social history Use of alcohol,  tobacco or illicit drugs  Current medications and supplements including opioid prescriptions. Patient is not currently taking opioid prescriptions. Functional ability and status Nutritional status Physical activity Advanced directives List of other physicians Hospitalizations, surgeries, and ER visits in previous 12 months Vitals Screenings to include cognitive, depression, and falls Referrals and appointments  In addition, I have reviewed and discussed with patient certain preventive protocols, quality metrics, and best practice recommendations. A written personalized care plan for preventive services as well as general preventive health recommendations were provided to patient.     Mickeal Needy, LPN   [id number]   After Visit Summary: (MyChart) Due to this being a telephonic visit, the after visit summary with patients  personalized plan was offered to patient via MyChart   Nurse Notes: None

## 2023-09-15 NOTE — Patient Instructions (Signed)
Martha Hayes , Thank you for taking time to come for your Medicare Wellness Visit. I appreciate your ongoing commitment to your health goals. Please review the following plan we discussed and let me know if I can assist you in the future.   Referrals/Orders/Follow-Ups/Clinician Recommendations: No  This is a list of the screening recommended for you and due dates:  Health Maintenance  Topic Date Due   DTaP/Tdap/Td vaccine (2 - Tdap) 12/20/2015   COVID-19 Vaccine (4 - 2023-24 season) 06/22/2023   Mammogram  06/10/2024   Medicare Annual Wellness Visit  09/14/2024   Colon Cancer Screening  03/06/2028   Pneumonia Vaccine  Completed   Flu Shot  Completed   DEXA scan (bone density measurement)  Completed   Hepatitis C Screening  Completed   Zoster (Shingles) Vaccine  Completed   HPV Vaccine  Aged Out    Advanced directives: (Copy Requested) Please bring a copy of your health care power of attorney and living will to the office to be added to your chart at your convenience.  Next Medicare Annual Wellness Visit scheduled for next year: No

## 2023-09-26 ENCOUNTER — Encounter: Payer: Medicare HMO | Admitting: Family Medicine

## 2023-09-30 ENCOUNTER — Ambulatory Visit (INDEPENDENT_AMBULATORY_CARE_PROVIDER_SITE_OTHER): Payer: Medicare HMO | Admitting: Family Medicine

## 2023-09-30 ENCOUNTER — Encounter: Payer: Self-pay | Admitting: Family Medicine

## 2023-09-30 VITALS — BP 120/60 | HR 65 | Temp 97.9°F | Ht 61.25 in | Wt 122.0 lb

## 2023-09-30 DIAGNOSIS — Z0000 Encounter for general adult medical examination without abnormal findings: Secondary | ICD-10-CM

## 2023-09-30 DIAGNOSIS — R7303 Prediabetes: Secondary | ICD-10-CM

## 2023-09-30 DIAGNOSIS — I10 Essential (primary) hypertension: Secondary | ICD-10-CM

## 2023-09-30 DIAGNOSIS — E7800 Pure hypercholesterolemia, unspecified: Secondary | ICD-10-CM | POA: Diagnosis not present

## 2023-09-30 DIAGNOSIS — K130 Diseases of lips: Secondary | ICD-10-CM | POA: Diagnosis not present

## 2023-09-30 LAB — COMPREHENSIVE METABOLIC PANEL
ALT: 25 U/L (ref 0–35)
AST: 22 U/L (ref 0–37)
Albumin: 4.5 g/dL (ref 3.5–5.2)
Alkaline Phosphatase: 101 U/L (ref 39–117)
BUN: 12 mg/dL (ref 6–23)
CO2: 29 meq/L (ref 19–32)
Calcium: 9.3 mg/dL (ref 8.4–10.5)
Chloride: 100 meq/L (ref 96–112)
Creatinine, Ser: 0.65 mg/dL (ref 0.40–1.20)
GFR: 87.76 mL/min (ref >60.00)
Glucose, Bld: 115 mg/dL — ABNORMAL HIGH (ref 70–99)
Potassium: 4.1 meq/L (ref 3.5–5.1)
Sodium: 136 meq/L (ref 135–145)
Total Bilirubin: 0.7 mg/dL (ref 0.2–1.2)
Total Protein: 7.1 g/dL (ref 6.0–8.3)

## 2023-09-30 LAB — LIPID PANEL
Cholesterol: 150 mg/dL (ref 0–200)
HDL: 40 mg/dL (ref >39.00)
LDL Cholesterol: 81 mg/dL (ref 0–99)
NonHDL: 109.53
Total CHOL/HDL Ratio: 4
Triglycerides: 141 mg/dL (ref 0.0–149.0)
VLDL: 28.2 mg/dL (ref 0.0–40.0)

## 2023-09-30 LAB — HEMOGLOBIN A1C: Hgb A1c MFr Bld: 5.8 % (ref 4.6–6.5)

## 2023-09-30 LAB — VITAMIN B12: Vitamin B-12: 815 pg/mL (ref 211–911)

## 2023-09-30 NOTE — Assessment & Plan Note (Signed)
Due for re-eval. 

## 2023-09-30 NOTE — Assessment & Plan Note (Signed)
Stable, chronic.  Continue current medication.  Lisinopril 10 mg daily  Coreg 6.25 mg  BID HCTZ 25 mg daily 

## 2023-09-30 NOTE — Patient Instructions (Signed)
Please stop at the lab to have labs drawn.  

## 2023-09-30 NOTE — Progress Notes (Signed)
Patient ID: Martha Hayes, female    DOB: Jun 12, 1951, 72 y.o.   MRN: [id number]  This visit was conducted in person.  BP 120/60 (BP Location: Left Arm, Patient Position: Sitting, Cuff Size: Normal)   Pulse 65   Temp 97.9 F (36.6 C) (Temporal)   Ht 5' 1.25" (1.556 m)   Wt 122 lb (55.3 kg)   SpO2 99%   BMI 22.86 kg/m    CC:  Chief Complaint  Patient presents with   Annual Exam    Part 2      Subjective:   HPI: Martha Hayes is a 72 y.o. female presenting on 09/30/2023  The patient presents for  complete physical and review of chronic health problems. He/She also has the following acute concerns today: none   The patient saw a LPN or RN for medicare wellness visit. 09/15/23  Prevention and wellness was reviewed in detail. Note reviewed and important notes copied below.   Hypertension:  well controlled on  Coreg, lisinopril  and HCTZ.  BP Readings from Last 3 Encounters:  09/30/23 120/60  08/14/23 110/62  01/30/23 (!) 142/75  Using medication without problems or lightheadedness:  none Chest pain with exertion: none Edema:none Short of breath: none Average home BPs: Other issues:  Elevated Cholesterol:  Due for re-eval. on Simvastatin 40 mg daily Lab Results  Component Value Date   CHOL 139 01/08/2023   HDL 43.20 01/08/2023   LDLCALC 63 01/08/2023   LDLDIRECT 153.4 05/07/2012   TRIG 166.0 (H) 01/08/2023   CHOLHDL 3 01/08/2023  Using medications without problems: none Muscle aches:  none Diet compliance: heart healthy diet Exercise: 5 days a week.. 3 miles Other complaints:  Prediabetes Wt Readings from Last 3 Encounters:  09/30/23 122 lb (55.3 kg)  09/15/23 122 lb (55.3 kg)  08/14/23 125 lb (56.7 kg)   Body mass index is 22.86 kg/m.  Seeing dermatolology yearly.      11/2022 bunionectomy Dr.  Ether Griffins  Relevant past medical, surgical, family and social history reviewed and updated as indicated. Interim medical history since our last visit  reviewed. Allergies and medications reviewed and updated. Outpatient Medications Prior to Visit  Medication Sig Dispense Refill   carvedilol (COREG) 6.25 MG tablet TAKE 1 TABLET BY MOUTH 2 TIMES DAILY WITH A MEAL. 180 tablet 3   Cholecalciferol (VITAMIN D3) 25 MCG (1000 UT) CAPS Take 1 capsule by mouth daily at 2 PM.     cyanocobalamin (VITAMIN B12) 1000 MCG tablet Take 1,000 mcg by mouth daily.     hydrochlorothiazide (MICROZIDE) 12.5 MG capsule TAKE 1 CAPSULE BY MOUTH EVERY DAY 90 capsule 3   lisinopril (ZESTRIL) 10 MG tablet TAKE 1 TABLET BY MOUTH EVERY DAY 90 tablet 3   mupirocin ointment (BACTROBAN) 2 % Apply 1 Application topically 2 (two) times daily as needed.     simvastatin (ZOCOR) 40 MG tablet Take 1 tablet (40 mg total) by mouth at bedtime. 30 tablet 11   mupirocin ointment (BACTROBAN) 2 % Apply 1 Application topically 2 (two) times daily. (Patient not taking: Reported on 09/15/2023) 22 g 0   No facility-administered medications prior to visit.     Per HPI unless specifically indicated in ROS section below Review of Systems  Constitutional:  Negative for fatigue and fever.  HENT:  Negative for congestion.   Eyes:  Negative for pain.  Respiratory:  Negative for cough and shortness of breath.   Cardiovascular:  Negative for chest pain, palpitations and leg swelling.  Gastrointestinal:  Negative for abdominal pain.  Genitourinary:  Negative for dysuria and vaginal bleeding.  Musculoskeletal:  Negative for back pain.  Neurological:  Negative for syncope, light-headedness and headaches.  Psychiatric/Behavioral:  Negative for dysphoric mood.    Objective:  BP 120/60 (BP Location: Left Arm, Patient Position: Sitting, Cuff Size: Normal)   Pulse 65   Temp 97.9 F (36.6 C) (Temporal)   Ht 5' 1.25" (1.556 m)   Wt 122 lb (55.3 kg)   SpO2 99%   BMI 22.86 kg/m   Wt Readings from Last 3 Encounters:  09/30/23 122 lb (55.3 kg)  09/15/23 122 lb (55.3 kg)  08/14/23 125 lb (56.7 kg)       Physical Exam Vitals and nursing note reviewed.  Constitutional:      General: She is not in acute distress.    Appearance: Normal appearance. She is well-developed. She is not ill-appearing or toxic-appearing.  HENT:     Head: Normocephalic.     Right Ear: Hearing, tympanic membrane, ear canal and external ear normal.     Left Ear: Hearing, tympanic membrane, ear canal and external ear normal.     Nose: Nose normal.  Eyes:     General: Lids are normal. Lids are everted, no foreign bodies appreciated.     Conjunctiva/sclera: Conjunctivae normal.     Pupils: Pupils are equal, round, and reactive to light.  Neck:     Thyroid: No thyroid mass or thyromegaly.     Vascular: No carotid bruit.     Trachea: Trachea normal.  Cardiovascular:     Rate and Rhythm: Normal rate and regular rhythm.     Heart sounds: Normal heart sounds, S1 normal and S2 normal. No murmur heard.    No gallop.  Pulmonary:     Effort: Pulmonary effort is normal. No respiratory distress.     Breath sounds: Normal breath sounds. No wheezing, rhonchi or rales.  Abdominal:     General: Bowel sounds are normal. There is no distension or abdominal bruit.     Palpations: Abdomen is soft. There is no fluid wave or mass.     Tenderness: There is no abdominal tenderness. There is no guarding or rebound.     Hernia: No hernia is present.  Musculoskeletal:     Cervical back: Normal range of motion and neck supple.  Lymphadenopathy:     Cervical: No cervical adenopathy.  Skin:    General: Skin is warm and dry.     Findings: No rash.  Neurological:     Mental Status: She is alert.     Cranial Nerves: No cranial nerve deficit.     Sensory: No sensory deficit.  Psychiatric:        Mood and Affect: Mood is not anxious or depressed.        Speech: Speech normal.        Behavior: Behavior normal. Behavior is cooperative.        Judgment: Judgment normal.       Results for orders placed or performed in visit on  01/08/23  Lipid panel  Result Value Ref Range   Cholesterol 139 0 - 200 mg/dL   Triglycerides 161.0 (H) 0.0 - 149.0 mg/dL   HDL 96.04 >54.09 mg/dL   VLDL 81.1 0.0 - 91.4 mg/dL   LDL Cholesterol 63 0 - 99 mg/dL   Total CHOL/HDL Ratio 3    NonHDL 96.24   Comprehensive metabolic panel  Result Value Ref Range  Sodium 137 135 - 145 mEq/L   Potassium 4.4 3.5 - 5.1 mEq/L   Chloride 104 96 - 112 mEq/L   CO2 27 19 - 32 mEq/L   Glucose, Bld 99 70 - 99 mg/dL   BUN 15 6 - 23 mg/dL   Creatinine, Ser 4.16 0.40 - 1.20 mg/dL   Total Bilirubin 0.6 0.2 - 1.2 mg/dL   Alkaline Phosphatase 97 39 - 117 U/L   AST 16 0 - 37 U/L   ALT 18 0 - 35 U/L   Total Protein 6.9 6.0 - 8.3 g/dL   Albumin 4.1 3.5 - 5.2 g/dL   GFR 60.63 >01.60 mL/min   Calcium 9.3 8.4 - 10.5 mg/dL     COVID 19 screen:  No recent travel or known exposure to COVID19 The patient denies respiratory symptoms of COVID 19 at this time. The importance of social distancing was discussed today.   Assessment and Plan The patient's preventative maintenance and recommended screening tests for an annual wellness exam were reviewed in full today. Brought up to date unless services declined.  Counselled on the importance of diet, exercise, and its role in overall health and mortality. The patient's FH and SH was reviewed, including their home life, tobacco status, and drug and alcohol status.    Vaccines: Uptodate  flu, PCV, COVID x 3, shingrix. Consider Td . PAP/DVE: partial hysterectomy,  No family history of ovarian cancer,DVE not indicated. Mammo:  05/2023,  yearly Colonoscopy: Dr. Ewing Schlein Date:  02/2018  Results: Diverticulosis , hemorrhoids, repeat in 10 years DEXA: 2010.. Osteopenia.. Taking vit D. Does not EVER want to do DEXA, would not use meds to treat.   Vit D in nml range. HEP C screen: done HIV screen: refused nonsmoker   Problem List Items Addressed This Visit     Essential hypertension (Chronic)    Stable, chronic.   Continue current medication.  Lisinopril 10 mg daily  Coreg 6.25 mg  BID HCTZ 25 mg daily      Prediabetes (Chronic)    Due for re-eval.      Pure hypercholesterolemia (Chronic)    Due for re-eval.      Other Visit Diagnoses     Routine general medical examination at a health care facility    -  Primary         Kerby Nora, MD

## 2023-11-02 ENCOUNTER — Other Ambulatory Visit: Payer: Self-pay | Admitting: Family Medicine

## 2023-11-03 ENCOUNTER — Other Ambulatory Visit: Payer: Self-pay | Admitting: Family Medicine

## 2023-12-16 ENCOUNTER — Ambulatory Visit: Payer: Self-pay | Admitting: Family Medicine

## 2023-12-17 ENCOUNTER — Other Ambulatory Visit: Payer: Self-pay | Admitting: Family Medicine

## 2024-01-27 ENCOUNTER — Encounter: Payer: Self-pay | Admitting: Dermatology

## 2024-01-27 ENCOUNTER — Ambulatory Visit: Payer: Medicare HMO | Admitting: Dermatology

## 2024-01-27 DIAGNOSIS — Z85828 Personal history of other malignant neoplasm of skin: Secondary | ICD-10-CM | POA: Diagnosis not present

## 2024-01-27 DIAGNOSIS — Z7189 Other specified counseling: Secondary | ICD-10-CM | POA: Diagnosis not present

## 2024-01-27 DIAGNOSIS — L853 Xerosis cutis: Secondary | ICD-10-CM

## 2024-01-27 DIAGNOSIS — L570 Actinic keratosis: Secondary | ICD-10-CM | POA: Diagnosis not present

## 2024-01-27 DIAGNOSIS — D229 Melanocytic nevi, unspecified: Secondary | ICD-10-CM

## 2024-01-27 DIAGNOSIS — W908XXA Exposure to other nonionizing radiation, initial encounter: Secondary | ICD-10-CM | POA: Diagnosis not present

## 2024-01-27 DIAGNOSIS — Z1283 Encounter for screening for malignant neoplasm of skin: Secondary | ICD-10-CM | POA: Diagnosis not present

## 2024-01-27 DIAGNOSIS — L905 Scar conditions and fibrosis of skin: Secondary | ICD-10-CM | POA: Diagnosis not present

## 2024-01-27 DIAGNOSIS — D1801 Hemangioma of skin and subcutaneous tissue: Secondary | ICD-10-CM

## 2024-01-27 DIAGNOSIS — D2271 Melanocytic nevi of right lower limb, including hip: Secondary | ICD-10-CM

## 2024-01-27 DIAGNOSIS — L821 Other seborrheic keratosis: Secondary | ICD-10-CM | POA: Diagnosis not present

## 2024-01-27 DIAGNOSIS — L814 Other melanin hyperpigmentation: Secondary | ICD-10-CM | POA: Diagnosis not present

## 2024-01-27 DIAGNOSIS — L578 Other skin changes due to chronic exposure to nonionizing radiation: Secondary | ICD-10-CM

## 2024-01-27 NOTE — Patient Instructions (Addendum)
 Cryotherapy Aftercare  Wash gently with soap and water everyday.   Apply Vaseline Jelly or Aquaphor daily until healed.    Recommend daily broad spectrum sunscreen SPF 30+ to sun-exposed areas, reapply every 2 hours as needed. Call for new or changing lesions.  Staying in the shade or wearing long sleeves, sun glasses (UVA+UVB protection) and wide brim hats (4-inch brim around the entire circumference of the hat) are also recommended for sun protection.     Melanoma ABCDEs  Melanoma is the most dangerous type of skin cancer, and is the leading cause of death from skin disease.  You are more likely to develop melanoma if you: Have light-colored skin, light-colored eyes, or red or blond hair Spend a lot of time in the sun Tan regularly, either outdoors or in a tanning bed Have had blistering sunburns, especially during childhood Have a close family member who has had a melanoma Have atypical moles or large birthmarks  Early detection of melanoma is key since treatment is typically straightforward and cure rates are extremely high if we catch it early.   The first sign of melanoma is often a change in a mole or a new dark spot.  The ABCDE system is a way of remembering the signs of melanoma.  A for asymmetry:  The two halves do not match. B for border:  The edges of the growth are irregular. C for color:  A mixture of colors are present instead of an even brown color. D for diameter:  Melanomas are usually (but not always) greater than 6mm - the size of a pencil eraser. E for evolution:  The spot keeps changing in size, shape, and color.  Please check your skin once per month between visits. You can use a small mirror in front and a large mirror behind you to keep an eye on the back side or your body.   If you see any new or changing lesions before your next follow-up, please call to schedule a visit.  Please continue daily skin protection including broad spectrum sunscreen SPF 30+ to  sun-exposed areas, reapplying every 2 hours as needed when you're outdoors.   Staying in the shade or wearing long sleeves, sun glasses (UVA+UVB protection) and wide brim hats (4-inch brim around the entire circumference of the hat) are also recommended for sun protection.     Gentle Skin Care Guide  1. Bathe no more than once a day.  2. Avoid bathing in hot water  3. Use a mild soap like Dove, Vanicream, Cetaphil, CeraVe. Can use Lever 2000 or Cetaphil antibacterial soap  4. Use soap only where you need it. On most days, use it under your arms, between your legs, and on your feet. Let the water rinse other areas unless visibly dirty.  5. When you get out of the bath/shower, use a towel to gently blot your skin dry, don't rub it.  6. While your skin is still a little damp, apply a moisturizing cream such as Vanicream, CeraVe, Cetaphil, Eucerin, Sarna lotion or plain Vaseline Jelly. For hands apply Neutrogena [place] Hand Cream or Excipial Hand Cream.  7. Reapply moisturizer any time you start to itch or feel dry.  8. Sometimes using free and clear laundry detergents can be helpful. Fabric softener sheets should be avoided. Downy Free & Gentle liquid, or any liquid fabric softener that is free of dyes and perfumes, it acceptable to use  9. If your doctor has given you prescription creams you may  apply moisturizers over them       Due to recent changes in healthcare laws, you may see results of your pathology and/or laboratory studies on MyChart before the doctors have had a chance to review them. We understand that in some cases there may be results that are confusing or concerning to you. Please understand that not all results are received at the same time and often the doctors may need to interpret multiple results in order to provide you with the best plan of care or course of treatment. Therefore, we ask that you please give [place] 2 business days to thoroughly review all your results  before contacting the office for clarification. Should we see a critical lab result, you will be contacted sooner.   If You Need Anything After Your Visit  If you have any questions or concerns for your doctor, please call our main line at [phone] and press option 4 to reach your doctor's medical assistant. If no one answers, please leave a voicemail as directed and we will return your call as soon as possible. Messages left after 4 pm will be answered the following business day.   You may also send [place] a message via MyChart. We typically respond to MyChart messages within 1-2 business days.  For prescription refills, please ask your pharmacy to contact our office. Our fax number is [phone].  If you have an urgent issue when the clinic is closed that cannot wait until the next business day, you can page your doctor at the number below.    Please note that while we do our best to be available for urgent issues outside of office hours, we are not available 24/7.   If you have an urgent issue and are unable to reach [place], you may choose to seek medical care at your doctor's office, retail clinic, urgent care center, or emergency room.  If you have a medical emergency, please immediately call 911 or go to the emergency department.  Pager Numbers  - Dr. Gwen Pounds: [phone]  - Dr. Roseanne Reno: [phone]  - Dr. Katrinka Blazing: [phone]   In the event of inclement weather, please call our main line at [phone] for an update on the status of any delays or closures.  Dermatology Medication Tips: Please keep the boxes that topical medications come in in order to help keep track of the instructions about where and how to use these. Pharmacies typically print the medication instructions only on the boxes and not directly on the medication tubes.   If your medication is too expensive, please contact our office at [phone] option 4 or send [place] a message through MyChart.   We are unable  to tell what your co-pay for medications will be in advance as this is different depending on your insurance coverage. However, we may be able to find a substitute medication at lower cost or fill out paperwork to get insurance to cover a needed medication.   If a prior authorization is required to get your medication covered by your insurance company, please allow [place] 1-2 business days to complete this process.  Drug prices often vary depending on where the prescription is filled and some pharmacies may offer cheaper prices.  The website www.goodrx.com contains coupons for medications through different pharmacies. The prices here do not account for what the cost may be with help from insurance (it may be cheaper with your insurance), but the website can give you the price if you did not use any insurance.  -  You can print the associated coupon and take it with your prescription to the pharmacy.  - You may also stop by our office during regular business hours and pick up a GoodRx coupon card.  - If you need your prescription sent electronically to a different pharmacy, notify our office through [facility] or by phone at [phone] option 4.     Si Usted Necesita Algo Despus de Su Visita  Tambin puede enviarnos un mensaje a travs de Clinical cytogeneticist. Por lo general respondemos a los mensajes de MyChart en el transcurso de 1 a 2 das hbiles.  Para renovar recetas, por favor pida a su farmacia que se ponga en contacto con nuestra oficina. Annie Sable de fax es [place] [phone].  Si tiene un asunto urgente cuando la clnica est cerrada y que no puede esperar hasta el siguiente da hbil, puede llamar/localizar a su doctor(a) al nmero que aparece a continuacin.   Por favor, tenga en cuenta que aunque hacemos todo lo posible para estar disponibles para asuntos urgentes fuera del horario de [place], no estamos disponibles las 24 horas del da, los 7 [address]  [address] de la [place].   Si tiene un  problema urgente y no puede comunicarse con nosotros, puede optar por buscar atencin mdica  en el consultorio de su doctor(a), en una clnica privada, en un centro de atencin urgente o en una sala de emergencias.  Si tiene Engineer, drilling, por favor llame inmediatamente al 911 o vaya a la sala de emergencias.  Nmeros de bper  - Dr. Gwen Pounds: [phone]  - Dra. Roseanne Reno: [phone]  - Dr. Katrinka Blazing: [phone]   En caso de inclemencias del tiempo, por favor llame a Lacy Duverney principal al [phone] para una actualizacin sobre el [place] de cualquier retraso o cierre.  Consejos para la medicacin en dermatologa: Por favor, guarde las cajas en las que vienen los medicamentos de uso tpico para ayudarle a seguir las instrucciones sobre dnde y cmo usarlos. Las farmacias generalmente imprimen las instrucciones del medicamento slo en las cajas y no directamente en los tubos del [place].   Si su medicamento es muy caro, por favor, pngase en contacto con Rolm [place]ala llamando al [phone] y presione la opcin 4 o envenos un mensaje a travs de Clinical cytogeneticist.   No podemos decirle cul ser su copago por los medicamentos por adelantado ya que esto es diferente dependiendo de la cobertura de su seguro. Sin embargo, es posible que podamos encontrar un medicamento sustituto a Audiological scientist un formulario para que el seguro cubra el medicamento que se considera necesario.   Si se requiere una autorizacin previa para que su compaa de seguros [place] su medicamento, por favor permtanos de 1 a 2 das hbiles para completar [address]  Los precios de los medicamentos varan con frecuencia dependiendo del Environmental consultant de dnde se surte la receta y alguna farmacias pueden ofrecer precios ms baratos.  El sitio web www.goodrx.com tiene cupones para medicamentos de Health and safety inspector. Los precios aqu no tienen en cuenta lo que podra costar con la ayuda del seguro (puede ser ms  barato con su seguro), pero el sitio web puede darle el precio si no utiliz Tourist information centre manager.  - Puede imprimir el cupn correspondiente y llevarlo con su receta a la farmacia.  - Tambin puede pasar por nuestra oficina durante el horario de atencin regular y Education officer, museum una tarjeta de cupones de GoodRx.  - Si necesita que su receta se enve electrnicamente a [place] [employer],  informe a nuestra oficina a travs de MyChart de [place] o por telfono llamando al [phone] y presione la opcin 4.

## 2024-01-27 NOTE — Progress Notes (Signed)
 Follow-Up Visit   Subjective  Martha Hayes is a 73 y.o. female who presents for the following: Skin Cancer Screening and Full Body Skin Exam. Hx of AKs. Hx of SCC.   The patient presents for Total-Body Skin Exam (TBSE) for skin cancer screening and mole check. The patient has spots, moles and lesions to be evaluated, some may be new or changing and the patient may have concern these could be cancer.   The following portions of the chart were reviewed this encounter and updated as appropriate: medications, allergies, medical history  Review of Systems:  No other skin or systemic complaints except as noted in HPI or Assessment and Plan.  Objective  Well appearing patient in no apparent distress; mood and affect are within normal limits.  A full examination was performed including scalp, head, eyes, ears, nose, lips, neck, chest, axillae, abdomen, back, buttocks, bilateral upper extremities, bilateral lower extremities, hands, feet, fingers, toes, fingernails, and toenails. All findings within normal limits unless otherwise noted below.   Relevant physical exam findings are noted in the Assessment and Plan.  L temple (adjacent to scar) x1, L mid jaw x1, L perioral x1 (3) Erythematous thin papules/macules with gritty scale.   Assessment & Plan   HISTORY OF SQUAMOUS CELL CARCINOMA OF THE SKIN. Left lateral zygoma, adjacent to scar. Mohs 10/24/2020. - No evidence of recurrence today - Recommend regular full body skin exams - Recommend daily broad spectrum sunscreen SPF 30+ to sun-exposed areas, reapply every 2 hours as needed.  - Call if any new or changing lesions are noted between office visits   SKIN CANCER SCREENING PERFORMED TODAY.  ACTINIC DAMAGE. Face, chest - Chronic condition, secondary to cumulative UV/sun exposure - diffuse scaly erythematous macules with underlying dyspigmentation - Recommend daily broad spectrum sunscreen SPF 30+ to sun-exposed areas, reapply every 2 hours  as needed.  - Staying in the shade or wearing long sleeves, sun glasses (UVA+UVB protection) and wide brim hats (4-inch brim around the entire circumference of the hat) are also recommended for sun protection.  - Call for new or changing lesions.  LENTIGINES, SEBORRHEIC KERATOSES, HEMANGIOMAS. Face, chest, legs  - Benign normal skin lesions - Benign-appearing - Call for any changes  MELANOCYTIC NEVI. Face, legs. - Tan-brown and/or pink-flesh-colored symmetric macules and papules - Tan papule at right popliteal.  - Benign appearing on exam today - Observation - Call clinic for new or changing moles - Recommend daily use of broad spectrum spf 30+ sunscreen to sun-exposed areas.    SCAR, Hx of Pilar Cyst Exam: Dyspigmented smooth depressed macule at vertex scalp. Benign-appearing.  Observation.  Call clinic for new or changing lesions. Recommend daily broad spectrum sunscreen SPF 30+, reapply every 2 hours as needed. Treatment: Recommend Serica moisturizing scar formula cream every night or [facility] brand or Mederma silicone scar sheet every night for the first year after a scar appears to help with scar remodeling if desired. Scars remodel on their own for a full year and will gradually improve in appearance over time.   Xerosis. Back - diffuse xerotic patches - recommend gentle, hydrating skin care - gentle skin care handout given   LENTIGO Exam: 1.5 cm speckled tan patch at right cheek Due to sun exposure Treatment Plan: Benign-appearing, observe. Recommend daily broad spectrum sunscreen SPF 30+ to sun-exposed areas, reapply every 2 hours as needed.  Call for any changes  Counseling for BBL / IPL / Laser and Coordination of Care Discussed the treatment option  of Broad Band Light (BBL) /Intense Pulsed Light (IPL)/ Laser for skin discoloration, including brown spots and redness.  Typically we recommend at least 1-3 treatment sessions about 5-8 weeks apart for best results.  Cannot  have tanned skin when BBL performed, and regular use of sunscreen/photoprotection is advised after the procedure to help maintain results. The patient's condition may also require "maintenance treatments" in the future.  The fee for BBL / laser treatments is $350 per treatment session for the whole face.  A fee can be quoted for other parts of the body.  Insurance typically does not pay for BBL/laser treatments and therefore the fee is an out-of-pocket cost. Recommend prophylactic valtrex treatment. Once scheduled for procedure, will send Rx in prior to patient's appointment.      AK (ACTINIC KERATOSIS) (3) L temple (adjacent to scar) x1, L mid jaw x1, L perioral x1 (3) Actinic keratoses are precancerous spots that appear secondary to cumulative UV radiation exposure/sun exposure over time. They are chronic with expected duration over 1 year. A portion of actinic keratoses will progress to squamous cell carcinoma of the skin. It is not possible to reliably predict which spots will progress to skin cancer and so treatment is recommended to prevent development of skin cancer.  Recommend daily broad spectrum sunscreen SPF 30+ to sun-exposed areas, reapply every 2 hours as needed.  Recommend staying in the shade or wearing long sleeves, sun glasses (UVA+UVB protection) and wide brim hats (4-inch brim around the entire circumference of the hat). Call for new or changing lesions. Destruction of lesion - L temple (adjacent to scar) x1, L mid jaw x1, L perioral x1 (3)  Destruction method: cryotherapy   Informed consent: discussed and consent obtained   Lesion destroyed using liquid nitrogen: Yes   Region frozen until ice ball extended beyond lesion: Yes   Outcome: patient tolerated procedure well with no complications   Post-procedure details: wound care instructions given   Additional details:  Prior to procedure, discussed risks of blister formation, small wound, skin dyspigmentation, or rare scar  following cryotherapy. Recommend Vaseline ointment to treated areas while healing.   Return in about 1 year (around 01/26/2025) for TBSE, HxAKs, HxSCC.  I, Lawson Radar, CMA, am acting as scribe for Willeen Niece, MD.   Documentation: I have reviewed the above documentation for accuracy and completeness, and I agree with the above.  Willeen Niece, MD

## 2024-03-01 DIAGNOSIS — H04123 Dry eye syndrome of bilateral lacrimal glands: Secondary | ICD-10-CM | POA: Diagnosis not present

## 2024-03-01 DIAGNOSIS — H40033 Anatomical narrow angle, bilateral: Secondary | ICD-10-CM | POA: Diagnosis not present

## 2024-03-01 DIAGNOSIS — H2513 Age-related nuclear cataract, bilateral: Secondary | ICD-10-CM | POA: Diagnosis not present

## 2024-03-01 DIAGNOSIS — H02831 Dermatochalasis of right upper eyelid: Secondary | ICD-10-CM | POA: Diagnosis not present

## 2024-04-05 DIAGNOSIS — M1711 Unilateral primary osteoarthritis, right knee: Secondary | ICD-10-CM | POA: Diagnosis not present

## 2024-04-27 ENCOUNTER — Other Ambulatory Visit: Payer: Self-pay | Admitting: Family Medicine

## 2024-04-27 DIAGNOSIS — Z1231 Encounter for screening mammogram for malignant neoplasm of breast: Secondary | ICD-10-CM

## 2024-05-03 ENCOUNTER — Ambulatory Visit: Admitting: Dermatology

## 2024-05-03 ENCOUNTER — Encounter: Payer: Self-pay | Admitting: Dermatology

## 2024-05-03 DIAGNOSIS — L820 Inflamed seborrheic keratosis: Secondary | ICD-10-CM

## 2024-05-03 NOTE — Progress Notes (Signed)
   Follow-Up Visit   Subjective  Martha Hayes is a 73 y.o. female who presents for the following: Irregular appearing skin lesion that patient thinks may be new. She is concerned it may be a SCC, and she would like checked today. She brought in rx for 5FU which she used in 2023 to the L temple, but she doesn't think it was the same location.   The following portions of the chart were reviewed this encounter and updated as appropriate: medications, allergies, medical history  Review of Systems:  No other skin or systemic complaints except as noted in HPI or Assessment and Plan.  Objective  Well appearing patient in no apparent distress; mood and affect are within normal limits.   A focused examination was performed of the following areas: the face   Relevant exam findings are noted in the Assessment and Plan.    Assessment & Plan    SEBORRHEIC KERATOSIS, inflamed, patient deferred treatment at this time.    - Stuck-on, waxy, tan-brown papules and/or plaques L temple - Benign-appearing - Discussed benign etiology and prognosis. - Observe - Call for any changes INFLAMED SEBORRHEIC KERATOSIS    Return for appointment as scheduled.  Martha Hayes, CMA, am acting as scribe for Boneta Sharps, MD .  Documentation: I have reviewed the above documentation for accuracy and completeness, and I agree with the above.  Boneta Sharps, MD

## 2024-05-03 NOTE — Patient Instructions (Signed)

## 2024-05-26 DIAGNOSIS — M1711 Unilateral primary osteoarthritis, right knee: Secondary | ICD-10-CM | POA: Diagnosis not present

## 2024-06-02 DIAGNOSIS — M1711 Unilateral primary osteoarthritis, right knee: Secondary | ICD-10-CM | POA: Diagnosis not present

## 2024-06-11 ENCOUNTER — Ambulatory Visit

## 2024-06-16 ENCOUNTER — Other Ambulatory Visit: Payer: Self-pay | Admitting: Family Medicine

## 2024-06-17 ENCOUNTER — Ambulatory Visit
Admission: RE | Admit: 2024-06-17 | Discharge: 2024-06-17 | Disposition: A | Discharge status: Home / Self Care | Source: Ambulatory Visit | Attending: Family Medicine | Admitting: Family Medicine

## 2024-06-17 DIAGNOSIS — Z1231 Encounter for screening mammogram for malignant neoplasm of breast: Secondary | ICD-10-CM

## 2024-06-22 ENCOUNTER — Ambulatory Visit: Payer: Self-pay | Admitting: Family Medicine

## 2024-06-30 ENCOUNTER — Ambulatory Visit: Payer: Self-pay

## 2024-06-30 NOTE — Telephone Encounter (Signed)
 FYI Only or Action Required?: FYI only for provider.  Patient was last seen in primary care on 09/30/2023 by Martha Greig BRAVO, MD.  Called Nurse Triage reporting Rib Injury.  Symptoms began a week ago.  Interventions attempted: Rest, hydration, or home remedies.  Symptoms are: unchanged.  Triage Disposition: See Physician Within 24 Hours  Patient/caregiver understands and will follow disposition?: Yes Reason for Disposition  [1] MODERATE pain (e.g., interferes with normal activities) AND [2] high-risk adult (e.g., age > 60 years, osteoporosis, chronic steroid use)  Answer Assessment - Initial Assessment Questions Patient denies higher acuity questions. ED precautions reviewed, pt verbalized understanding.   1. MECHANISM: How did the injury happen?     Fell from sitting position/8 stool onto left side 06/21/24  2. ONSET: When did the injury happen? (.e.g., minutes, hours, days ago)     06/21/24  3. LOCATION: Where on the chest is the injury located?     Left side of rib cage  4. APPEARANCE: What does the injury look like?     Denies bruising  5. BLEEDING: Is there any bleeding now? If Yes, ask: How long has it been bleeding?     Denies  6. SEVERITY: Any difficulty with breathing?     Denies  7. PAIN: Is there pain? If Yes, ask: How bad is the pain? (e.g., Scale 0-10; none, mild, moderate, severe)     5/10, described as sore, worse with deep breath  Protocols used: Chest Injury-A-AH Copied from CRM #[id number]. Topic: Clinical - Red Word Triage >> Jun 30, 2024  8:51 AM [name] wrote: Red Word that prompted transfer to Nurse Triage: discomfort in left rib cage, recent fall(06/21/24), sore to take a deep breathe and to lay down

## 2024-07-01 ENCOUNTER — Ambulatory Visit: Admitting: Family Medicine

## 2024-07-01 NOTE — Telephone Encounter (Signed)
 Noted

## 2024-07-05 ENCOUNTER — Encounter: Payer: Self-pay | Admitting: Pharmacist

## 2024-07-05 NOTE — Progress Notes (Signed)
 Pharmacy Quality Measure Review  This patient is appearing on a report for being at risk of failing the adherence measure for hypertension (ACEi/ARB) medications this calendar year.   Medication: lisinopril  10mg  Last fill date: 02/01/24 for 90 day supply  Insurance report was not up to date. No action needed at this time.  Medication has been refilled as of 05/01/24 x90 day supply.

## 2024-08-03 ENCOUNTER — Ambulatory Visit

## 2024-08-04 ENCOUNTER — Ambulatory Visit (INDEPENDENT_AMBULATORY_CARE_PROVIDER_SITE_OTHER)

## 2024-08-04 DIAGNOSIS — Z23 Encounter for immunization: Secondary | ICD-10-CM

## 2024-08-04 NOTE — Progress Notes (Signed)
 Per orders of Dr. Greig Ring, injection of HD flu vaccine given by Bobbette Sprague in left deltoid. Patient tolerated injection well.

## 2024-08-18 DIAGNOSIS — M25561 Pain in right knee: Secondary | ICD-10-CM | POA: Diagnosis not present

## 2024-09-08 DIAGNOSIS — M1711 Unilateral primary osteoarthritis, right knee: Secondary | ICD-10-CM | POA: Diagnosis not present

## 2024-09-30 ENCOUNTER — Ambulatory Visit: Payer: Self-pay | Admitting: Family Medicine

## 2024-09-30 ENCOUNTER — Encounter: Payer: Self-pay | Admitting: Family Medicine

## 2024-09-30 ENCOUNTER — Ambulatory Visit (INDEPENDENT_AMBULATORY_CARE_PROVIDER_SITE_OTHER): Admitting: Family Medicine

## 2024-09-30 VITALS — BP 114/70 | HR 67 | Temp 98.1°F | Ht 61.0 in | Wt 126.0 lb

## 2024-09-30 DIAGNOSIS — M8580 Other specified disorders of bone density and structure, unspecified site: Secondary | ICD-10-CM | POA: Diagnosis not present

## 2024-09-30 DIAGNOSIS — Z130 Encounter for screening for diseases of the blood and blood-forming organs and certain disorders involving the immune mechanism: Secondary | ICD-10-CM | POA: Diagnosis not present

## 2024-09-30 DIAGNOSIS — E7800 Pure hypercholesterolemia, unspecified: Secondary | ICD-10-CM | POA: Diagnosis not present

## 2024-09-30 DIAGNOSIS — Z0000 Encounter for general adult medical examination without abnormal findings: Secondary | ICD-10-CM | POA: Diagnosis not present

## 2024-09-30 DIAGNOSIS — I471 Supraventricular tachycardia, unspecified: Secondary | ICD-10-CM | POA: Diagnosis not present

## 2024-09-30 DIAGNOSIS — R7303 Prediabetes: Secondary | ICD-10-CM

## 2024-09-30 DIAGNOSIS — I10 Essential (primary) hypertension: Secondary | ICD-10-CM | POA: Diagnosis not present

## 2024-09-30 LAB — COMPREHENSIVE METABOLIC PANEL WITH GFR
ALT: 20 U/L (ref 0–35)
AST: 17 U/L (ref 0–37)
Albumin: 4.7 g/dL (ref 3.5–5.2)
Alkaline Phosphatase: 97 U/L (ref 39–117)
BUN: 16 mg/dL (ref 6–23)
CO2: 29 meq/L (ref 19–32)
Calcium: 9.5 mg/dL (ref 8.4–10.5)
Chloride: 101 meq/L (ref 96–112)
Creatinine, Ser: 0.65 mg/dL (ref 0.40–1.20)
GFR: 87.15 mL/min (ref >60.00)
Glucose, Bld: 105 mg/dL — ABNORMAL HIGH (ref 70–99)
Potassium: 4.5 meq/L (ref 3.5–5.1)
Sodium: 136 meq/L (ref 135–145)
Total Bilirubin: 0.6 mg/dL (ref 0.2–1.2)
Total Protein: 7 g/dL (ref 6.0–8.3)

## 2024-09-30 LAB — CBC WITH DIFFERENTIAL/PLATELET
Basophils Absolute: 0 K/uL (ref 0.0–0.1)
Basophils Relative: 0.7 % (ref 0.0–3.0)
Eosinophils Absolute: 0.2 K/uL (ref 0.0–0.7)
Eosinophils Relative: 2.5 % (ref 0.0–5.0)
HCT: 37.8 % (ref 36.0–46.0)
Hemoglobin: 13 g/dL (ref 12.0–15.0)
Lymphocytes Relative: 35 % (ref 12.0–46.0)
Lymphs Abs: 2.2 K/uL (ref 0.7–4.0)
MCHC: 34.4 g/dL (ref 30.0–36.0)
MCV: 87.7 fl (ref 78.0–100.0)
Monocytes Absolute: 0.4 K/uL (ref 0.1–1.0)
Monocytes Relative: 6.8 % (ref 3.0–12.0)
Neutro Abs: 3.4 K/uL (ref 1.4–7.7)
Neutrophils Relative %: 55 % (ref 43.0–77.0)
Platelets: 343 K/uL (ref 150.0–400.0)
RBC: 4.31 Mil/uL (ref 3.87–5.11)
RDW: 12.9 % (ref 11.5–15.5)
WBC: 6.2 K/uL (ref 4.0–10.5)

## 2024-09-30 LAB — LIPID PANEL
Cholesterol: 169 mg/dL (ref 0–200)
HDL: 50.8 mg/dL (ref >39.00)
LDL Cholesterol: 93 mg/dL (ref 0–99)
NonHDL: 118.46
Total CHOL/HDL Ratio: 3
Triglycerides: 127 mg/dL (ref 0.0–149.0)
VLDL: 25.4 mg/dL (ref 0.0–40.0)

## 2024-09-30 LAB — HEMOGLOBIN A1C: Hgb A1c MFr Bld: 6 % (ref 4.6–6.5)

## 2024-09-30 NOTE — Assessment & Plan Note (Signed)
Not interested in DEXA eval. Recommend weight bearing exercise, calcium in diet and vit D supplement 400 IU 1-2 times daily.

## 2024-09-30 NOTE — Progress Notes (Signed)
 Patient ID: Martha Hayes, female    DOB: 1951-09-17, 73 y.o.   MRN: [id number]  This visit was conducted in person.  BP 114/70 (BP Location: Left Arm, Patient Position: Sitting, Cuff Size: Normal)   Pulse 67   Temp 98.1 F (36.7 C) (Temporal)   Ht 5' 1 (1.549 m)   Wt 126 lb (57.2 kg)   SpO2 99%   BMI 23.81 kg/m    CC:  Chief Complaint  Patient presents with   Annual Exam      Subjective:   HPI: Martha Hayes is a 72 y.o. female presenting on 09/30/2024  The patient presents for  complete physical and review of chronic health problems. He/She also has the following acute concerns today: none   Has OA in right knee.. cortisone and gel treatments  Doing lower impact exercises. . The patient saw a LPN or RN for medicare wellness visit. 09/15/23  Prevention and wellness was reviewed in detail. Note reviewed and important notes copied below.   Hypertension:  well controlled on  Coreg , lisinopril   and HCTZ.  BP Readings from Last 3 Encounters:  09/30/24 114/70  09/30/23 120/60  08/14/23 110/62  Using medication without problems or lightheadedness:  none Chest pain with exertion: none Edema:none Short of breath: none Average home BPs: Other issues:  Elevated Cholesterol:  Due for re-eval. on Simvastatin  40 mg daily Lab Results  Component Value Date   CHOL 150 09/30/2023   HDL 40.00 09/30/2023   LDLCALC 81 09/30/2023   LDLDIRECT 153.4 05/07/2012   TRIG 141.0 09/30/2023   CHOLHDL 4 09/30/2023  Using medications without problems: none Muscle aches:  none Diet compliance: heart healthy diet Exercise: 5 days a week.. elliptical Other complaints:  Prediabetes Wt Readings from Last 3 Encounters:  09/30/24 126 lb (57.2 kg)  09/30/23 122 lb (55.3 kg)  09/15/23 122 lb (55.3 kg)   Body mass index is 23.81 kg/m.  Seeing dermatolology yearly.      11/2022 bunionectomy Dr.  Ashley  Relevant past medical, surgical, family and social history reviewed and updated as  indicated. Interim medical history since our last visit reviewed. Allergies and medications reviewed and updated. Outpatient Medications Prior to Visit  Medication Sig Dispense Refill   carvedilol  (COREG ) 6.25 MG tablet TAKE 1 TABLET BY MOUTH TWICE A DAY WITH FOOD 180 tablet 3   Cholecalciferol (VITAMIN D3) 25 MCG (1000 UT) CAPS Take 1 capsule by mouth daily at 2 PM.     cyanocobalamin  (VITAMIN B12) 1000 MCG tablet Take 1,000 mcg by mouth daily.     hydrochlorothiazide  (MICROZIDE ) 12.5 MG capsule TAKE 1 CAPSULE BY MOUTH EVERY DAY 90 capsule 3   lisinopril  (ZESTRIL ) 10 MG tablet TAKE 1 TABLET BY MOUTH EVERY DAY 90 tablet 3   simvastatin  (ZOCOR ) 40 MG tablet TAKE 1 TABLET BY MOUTH EVERYDAY AT BEDTIME 90 tablet 3   mupirocin  ointment (BACTROBAN ) 2 % Apply 1 Application topically 2 (two) times daily as needed. (Patient not taking: Reported on 05/03/2024)     No facility-administered medications prior to visit.     Per HPI unless specifically indicated in ROS section below Review of Systems  Constitutional:  Negative for fatigue and fever.  HENT:  Negative for congestion.   Eyes:  Negative for pain.  Respiratory:  Negative for cough and shortness of breath.   Cardiovascular:  Negative for chest pain, palpitations and leg swelling.  Gastrointestinal:  Negative for abdominal pain.  Genitourinary:  Negative for dysuria and  vaginal bleeding.  Musculoskeletal:  Negative for back pain.  Neurological:  Negative for syncope, light-headedness and headaches.  Psychiatric/Behavioral:  Negative for dysphoric mood.    Objective:  BP 114/70 (BP Location: Left Arm, Patient Position: Sitting, Cuff Size: Normal)   Pulse 67   Temp 98.1 F (36.7 C) (Temporal)   Ht 5' 1 (1.549 m)   Wt 126 lb (57.2 kg)   SpO2 99%   BMI 23.81 kg/m   Wt Readings from Last 3 Encounters:  09/30/24 126 lb (57.2 kg)  09/30/23 122 lb (55.3 kg)  09/15/23 122 lb (55.3 kg)      Physical Exam Vitals and nursing note  reviewed.  Constitutional:      General: She is not in acute distress.    Appearance: Normal appearance. She is well-developed. She is not ill-appearing or toxic-appearing.  HENT:     Head: Normocephalic.     Right Ear: Hearing, tympanic membrane, ear canal and external ear normal.     Left Ear: Hearing, tympanic membrane, ear canal and external ear normal.     Nose: Nose normal.  Eyes:     General: Lids are normal. Lids are everted, no foreign bodies appreciated.     Conjunctiva/sclera: Conjunctivae normal.     Pupils: Pupils are equal, round, and reactive to light.  Neck:     Thyroid : No thyroid  mass or thyromegaly.     Vascular: No carotid bruit.     Trachea: Trachea normal.  Cardiovascular:     Rate and Rhythm: Normal rate and regular rhythm.     Heart sounds: Normal heart sounds, S1 normal and S2 normal. No murmur heard.    No gallop.  Pulmonary:     Effort: Pulmonary effort is normal. No respiratory distress.     Breath sounds: Normal breath sounds. No wheezing, rhonchi or rales.  Abdominal:     General: Bowel sounds are normal. There is no distension or abdominal bruit.     Palpations: Abdomen is soft. There is no fluid wave or mass.     Tenderness: There is no abdominal tenderness. There is no guarding or rebound.     Hernia: No hernia is present.  Musculoskeletal:     Cervical back: Normal range of motion and neck supple.  Lymphadenopathy:     Cervical: No cervical adenopathy.  Skin:    General: Skin is warm and dry.     Findings: No rash.  Neurological:     Mental Status: She is alert.     Cranial Nerves: No cranial nerve deficit.     Sensory: No sensory deficit.  Psychiatric:        Mood and Affect: Mood is not anxious or depressed.        Speech: Speech normal.        Behavior: Behavior normal. Behavior is cooperative.        Judgment: Judgment normal.       Results for orders placed or performed in visit on 09/30/23  Hemoglobin A1c   Collection Time:  09/30/23  8:53 AM  Result Value Ref Range   Hgb A1c MFr Bld 5.8 4.6 - 6.5 %  Lipid panel   Collection Time: 09/30/23  8:53 AM  Result Value Ref Range   Cholesterol 150 0 - 200 mg/dL   Triglycerides 858.9 0.0 - 149.0 mg/dL   HDL 59.99 >60.99 mg/dL   VLDL 71.7 0.0 - 59.9 mg/dL   LDL Cholesterol 81 0 - 99 mg/dL  Total CHOL/HDL Ratio 4    NonHDL 109.53   Comprehensive metabolic panel   Collection Time: 09/30/23  8:53 AM  Result Value Ref Range   Sodium 136 135 - 145 mEq/L   Potassium 4.1 3.5 - 5.1 mEq/L   Chloride 100 96 - 112 mEq/L   CO2 29 19 - 32 mEq/L   Glucose, Bld 115 (H) 70 - 99 mg/dL   BUN 12 6 - 23 mg/dL   Creatinine, Ser 9.34 0.40 - 1.20 mg/dL   Total Bilirubin 0.7 0.2 - 1.2 mg/dL   Alkaline Phosphatase 101 39 - 117 U/L   AST 22 0 - 37 U/L   ALT 25 0 - 35 U/L   Total Protein 7.1 6.0 - 8.3 g/dL   Albumin 4.5 3.5 - 5.2 g/dL   GFR 12.23 >39.99 mL/min   Calcium  9.3 8.4 - 10.5 mg/dL  Vitamin A87   Collection Time: 09/30/23  8:53 AM  Result Value Ref Range   Vitamin B-12 815 211 - 911 pg/mL     COVID 19 screen:  No recent travel or known exposure to COVID19 The patient denies respiratory symptoms of COVID 19 at this time. The importance of social distancing was discussed today.   Assessment and Plan The patient's preventative maintenance and recommended screening tests for an annual wellness exam were reviewed in full today. Brought up to date unless services declined.  Counselled on the importance of diet, exercise, and its role in overall health and mortality. The patient's FH and SH was reviewed, including their home life, tobacco status, and drug and alcohol status.    Vaccines: Uptodate  flu, PCV, COVID x 3, shingrix. Consider Td . PAP/DVE: partial hysterectomy,  No family history of ovarian cancer,DVE not indicated. Mammo:  05/2024,  yearly Colonoscopy: Dr. Rosalie Date:  02/2018  Results: Diverticulosis , hemorrhoids, repeat in 10 years DEXA: 2010..  Osteopenia.. Taking vit D. Does not EVER want to do DEXA, would not use meds to treat.   Vit D in nml range. HEP C screen: done HIV screen: refused nonsmoker   Problem List Items Addressed This Visit     Essential hypertension (Chronic)   Stable, chronic.  Continue current medication.  Lisinopril  10 mg daily  Coreg  6.25 mg  BID HCTZ 25 mg daily      Osteopenia (Chronic)   Not interested in DEXA eval. Recommend weight bearing exercise, calcium  in diet and vit D supplement 400 IU 1-2 times daily.       Prediabetes (Chronic)   Due for re-eval.      Relevant Orders   Hemoglobin A1c   PSVT (paroxysmal supraventricular tachycardia) (Chronic)   Stable, chronic.  Continue current medication.   Coreg  6.25 mg  BID       Pure hypercholesterolemia (Chronic)   Due for re-eval.      Relevant Orders   Lipid panel   Comprehensive metabolic panel with GFR   Other Visit Diagnoses       Routine general medical examination at a health care facility    -  Primary     Screening, deficiency anemia, iron       Relevant Orders   CBC with Differential/Platelet          Greig Ring, MD

## 2024-09-30 NOTE — Assessment & Plan Note (Signed)
 Due for re-eval.

## 2024-09-30 NOTE — Assessment & Plan Note (Signed)
Stable, chronic.  Continue current medication.   Coreg 6.25 mg  BID

## 2024-09-30 NOTE — Assessment & Plan Note (Signed)
Stable, chronic.  Continue current medication.  Lisinopril 10 mg daily  Coreg 6.25 mg  BID HCTZ 25 mg daily 

## 2024-10-13 ENCOUNTER — Other Ambulatory Visit: Payer: Self-pay | Admitting: Family Medicine

## 2024-10-15 ENCOUNTER — Encounter: Payer: Self-pay | Admitting: Family

## 2024-10-15 ENCOUNTER — Ambulatory Visit: Admitting: Family

## 2024-10-15 ENCOUNTER — Ambulatory Visit: Payer: Self-pay

## 2024-10-15 VITALS — BP 134/66 | HR 67 | Temp 97.9°F | Ht 61.0 in | Wt 126.4 lb

## 2024-10-15 DIAGNOSIS — M545 Low back pain, unspecified: Secondary | ICD-10-CM

## 2024-10-15 DIAGNOSIS — S39012A Strain of muscle, fascia and tendon of lower back, initial encounter: Secondary | ICD-10-CM | POA: Diagnosis not present

## 2024-10-15 MED ORDER — PREDNISONE 20 MG PO TABS: 40.0000 mg | ORAL_TABLET | Freq: Every day | ORAL | 0 refills | Status: AC

## 2024-10-15 MED ORDER — CYCLOBENZAPRINE HCL 5 MG PO TABS: 5.0000 mg | ORAL_TABLET | Freq: Three times a day (TID) | ORAL | 0 refills | Status: AC | PRN

## 2024-10-15 NOTE — Telephone Encounter (Signed)
 Called and spoke to pt. She was seen today.

## 2024-10-15 NOTE — Telephone Encounter (Signed)
 FYI Only or Action Required?: FYI only for provider: appointment scheduled on 10/15/24.  Patient was last seen in primary care on 09/30/2024 by Avelina Greig BRAVO, MD.  Called Nurse Triage reporting Back Pain.  Symptoms began several days ago.  Interventions attempted: OTC medications: ibuprofen.  Symptoms are: unchanged.  Triage Disposition: See HCP Within 4 Hours (Or PCP Triage)  Patient/caregiver understands and will follow disposition?: Yes   Copied from CRM #[id number]. Topic: Clinical - Red Word Triage >> Oct 15, 2024  8:55 AM Viola F wrote: Red Word that prompted transfer to Nurse Triage: Patient having back pain - requesting muscle relaxer Reason for Disposition  [1] SEVERE back pain (e.g., excruciating, unable to do any normal activities) AND [2] not improved 2 hours after pain medicine  Answer Assessment - Initial Assessment Questions Scheduled 10/15/24  Advised call back or ED if symptoms worsen. Patient verbalized understanding.    1. ONSET: When did the pain begin? (e.g., minutes, hours, days)     Tuesday 2. LOCATION: Where does it hurt? (upper, mid or lower back)     Lower back pain; left side 3. SEVERITY: How bad is the pain?  (e.g., Scale 1-10; mild, moderate, or severe)     7/10; 4. PATTERN: Is the pain constant? (e.g., yes, no; constant, intermittent)      Constant worse with movement 5. RADIATION: Does the pain shoot into your legs or somewhere else?     no 6. CAUSE:  What do you think is causing the back pain?      Yard work 7. BACK OVERUSE:  Any recent lifting of heavy objects, strenuous work or exercise?     Twisting and bending 8. MEDICINES: What have you taken so far for the pain? (e.g., nothing, acetaminophen , NSAIDS)     ibuprofen 9. NEUROLOGIC SYMPTOMS: Do you have any weakness, numbness, or problems with bowel/bladder control?     denies 10. OTHER SYMPTOMS: Do you have any other symptoms? (e.g., fever, abdomen pain, burning  with urination, blood in urine)    denies  Protocols used: Back Pain-A-AH

## 2024-10-15 NOTE — Progress Notes (Signed)
 "  Acute Office Visit  Subjective:     Patient ID: Martha Hayes, female    DOB: 07-Jan-1951, 73 y.o.   MRN: [id number]  Chief Complaint  Patient presents with   Back Pain    C/o L-side low back pain after working in yard. Started 10/12/24. Pain occurs going from sitting to standing, standing to sitting and bending at hip.     HPI Patient is in today with complaints of low back pain after working in her yard on October 12, 2024.  Reports that she was raking leaves picking up heavy things bagging leaves.  The pain is about a 7 out of 10 and worse with movement.  No obvious swelling.  No obvious injury.  No issues with urinary frequency, urgency, or burning.  Has had this issue in the past that she was able to resolve at home.  Review of Systems  Respiratory: Negative.    Cardiovascular: Negative.   Genitourinary: Negative.   Musculoskeletal:  Positive for back pain. Negative for falls.       Heavy lifting  Neurological: Negative.  Negative for tingling.  Psychiatric/Behavioral: Negative.    All other systems reviewed and are negative.  Past Medical History:  Diagnosis Date   Anemia, unspecified    due to menorrhagia   DDD (degenerative disc disease), lumbosacral    MRI lumbar 2003   Hyperlipidemia    Hypertension    PONV (postoperative nausea and vomiting)    PSVT (paroxysmal supraventricular tachycardia)    Squamous cell carcinoma of skin 09/13/2020   left lat zygoma adjacent to scar, Moh's 10/24/20    Social History   Socioeconomic History   Marital status: Married    Spouse name: Not on file   Number of children: Not on file   Years of education: Not on file   Highest education level: Associate degree: occupational, scientist, product/process development, or vocational program  Occupational History   Occupation: Teacher, Adult Education: [employer]    Comment: [employer]  Tobacco Use   Smoking status: Never   Smokeless tobacco: Never  Vaping Use   Vaping status: Never Used   Substance and Sexual Activity   Alcohol use: Yes    Alcohol/week: 5.0 standard drinks of alcohol    Types: 5 Glasses of wine per week    Comment: occasionally   Drug use: No   Sexual activity: Not on file  Other Topics Concern   Not on file  Social History Narrative   Not on file   Social Drivers of Health   Tobacco Use: Low Risk (10/15/2024)   Patient History    Smoking Tobacco Use: Never    Smokeless Tobacco Use: Never    Passive Exposure: Not on file  Financial Resource Strain: Low Risk (09/26/2023)   Overall Financial Resource Strain (CARDIA)    Difficulty of Paying Living Expenses: Not hard at all  Food Insecurity: No Food Insecurity (09/26/2023)   Hunger Vital Sign    Worried About Running Out of Food in the Last Year: Never true    Ran Out of Food in the Last Year: Never true  Transportation Needs: No Transportation Needs (09/26/2023)   PRAPARE - Administrator, Civil Service (Medical): No    Lack of Transportation (Non-Medical): No  Physical Activity: Sufficiently Active (09/26/2023)   Exercise Vital Sign    Days of Exercise per Week: 5 days    Minutes of Exercise per Session: 70 min  Stress: No Stress  Concern Present (09/15/2023)   [employer] of Occupational Health - Occupational Stress Questionnaire    Feeling of Stress : Not at all  Social Connections: Moderately Integrated (09/26/2023)   Social Connection and Isolation Panel    Frequency of Communication with Friends and Family: More than three times a week    Frequency of Social Gatherings with Friends and Family: More than three times a week    Attends Religious Services: More than 4 times per year    Active Member of [employer] or Organizations: No    Attends Banker Meetings: Patient declined    Marital Status: Married  Catering Manager Violence: Not At Risk (09/15/2023)   Humiliation, Afraid, Rape, and Kick questionnaire    Fear of Current or Ex-Partner: No     Emotionally Abused: No    Physically Abused: No    Sexually Abused: No  Depression (PHQ2-9): Low Risk (10/15/2024)   Depression (PHQ2-9)    PHQ-2 Score: 0  Alcohol Screen: Low Risk (09/26/2023)   Alcohol Screen    Last Alcohol Screening Score (AUDIT): 1  Housing: Low Risk (09/26/2023)   Housing    Last Housing Risk Score: 0  Utilities: Not At Risk (09/15/2023)   AHC Utilities    Threatened with loss of utilities: No  Health Literacy: Adequate Health Literacy (09/15/2023)   B1300 Health Literacy    Frequency of need for help with medical instructions: Never    Past Surgical History:  Procedure Laterality Date   BUNIONECTOMY  1997   right   BUNIONECTOMY Left 12/18/2022   Procedure: BUNIONECTOMY;  Surgeon: Ashley Soulier, DPM;  Location: [facility];  Service: Podiatry;  Laterality: Left;   PARTIAL HYSTERECTOMY  1992   uterine fibroids   TONSILLECTOMY  1959    Family History  Problem Relation Age of Onset   Heart disease Mother        CAD, two stents   Diabetes Mother        developed in 59's   Osteoporosis Mother    Hypertension Mother    Diabetes Father    Hypertension Father    Seizures Father        petit mal seizure disorder   Heart disease Father        defibrillation   Heart disease Sister        pacemaker   Sudden Cardiac Death Sister 62       aborted; had h/o bundle branch block   Breast cancer Neg Hx     Allergies[1]  Medications Ordered Prior to Encounter[2]  BP 134/66   Pulse 67   Temp 97.9 F (36.6 C) (Oral)   Ht 5' 1 (1.549 m)   Wt 126 lb 6 oz (57.3 kg)   SpO2 100%   BMI 23.88 kg/m chart      Objective:    BP 134/66   Pulse 67   Temp 97.9 F (36.6 C) (Oral)   Ht 5' 1 (1.549 m)   Wt 126 lb 6 oz (57.3 kg)   SpO2 100%   BMI 23.88 kg/m    Physical Exam Vitals and nursing note reviewed.  Constitutional:      Appearance: Normal appearance. She is normal weight.  Cardiovascular:     Rate and  Rhythm: Normal rate and regular rhythm.  Pulmonary:     Effort: Pulmonary effort is normal.     Breath sounds: Normal breath sounds.  Musculoskeletal:     Cervical back: Normal range of  motion and neck supple.     Comments: Low back: Pain elicited with straight leg raise maneuver.  Pain also elicited with rotation of the spine.  Has about a 40 degree flexion at the hip before pain is elicited.  Skin:    General: Skin is warm and dry.  Neurological:     General: No focal deficit present.     Mental Status: She is alert and oriented to person, place, and time. Mental status is at baseline.  Psychiatric:        Mood and Affect: Mood normal.        Behavior: Behavior normal.        Thought Content: Thought content normal.        Judgment: Judgment normal.    No results found for any visits on 10/15/24.      Assessment & Plan:   Problem List Items Addressed This Visit   None Visit Diagnoses       Acute bilateral low back pain without sciatica    -  Primary   Relevant Medications   cyclobenzaprine  (FLEXERIL ) 5 MG tablet   predniSONE  (DELTASONE ) 20 MG tablet     Strain of muscle, fascia and tendon of lower back, initial encounter           Meds ordered this encounter  Medications   cyclobenzaprine  (FLEXERIL ) 5 MG tablet    Sig: Take 1 tablet (5 mg total) by mouth 3 (three) times daily as needed for muscle spasms.    Dispense:  30 tablet    Refill:  0   predniSONE  (DELTASONE ) 20 MG tablet    Sig: Take 2 tablets (40 mg total) by mouth daily with breakfast.    Dispense:  10 tablet    Refill:  0  Call the office with any questions or concerns.  Recheck as scheduled and sooner as needed.  No follow-ups on file.  Nyshaun Standage B Heston Widener, FNP       [1] Allergies Allergen Reactions   Penicillins     REACTION: rash  [2] Current Outpatient Medications on File Prior to Visit  Medication Sig Dispense Refill   carvedilol  (COREG ) 6.25 MG tablet TAKE 1 TABLET BY MOUTH TWICE A  DAY WITH FOOD 180 tablet 3   Cholecalciferol (VITAMIN D3) 25 MCG (1000 UT) CAPS Take 1 capsule by mouth daily at 2 PM.     hydrochlorothiazide  (MICROZIDE ) 12.5 MG capsule TAKE 1 CAPSULE BY MOUTH EVERY DAY 90 capsule 3   lisinopril  (ZESTRIL ) 10 MG tablet TAKE 1 TABLET BY MOUTH EVERY DAY 90 tablet 3   simvastatin  (ZOCOR ) 40 MG tablet TAKE 1 TABLET BY MOUTH EVERYDAY AT BEDTIME 90 tablet 3   No current facility-administered medications on file prior to visit.  "

## 2024-10-15 NOTE — Telephone Encounter (Signed)
 Patient disconnected call prior to warm transfer. Nurse will attempt to contact patient.

## 2024-10-19 ENCOUNTER — Other Ambulatory Visit (HOSPITAL_COMMUNITY): Payer: Self-pay

## 2024-10-19 ENCOUNTER — Telehealth: Payer: Self-pay

## 2024-10-19 NOTE — Telephone Encounter (Signed)
 Pharmacy Patient Advocate Encounter   Received notification from Onbase that prior authorization for Cyclobenzaprine  HCl 5MG  tablets  is required/requested.   Insurance verification completed.   The patient is insured through [facility] ADVANTAGE/RX ADVANCE.   Per test claim: PA required; PA submitted to above mentioned insurance via Latent Key/confirmation #/EOC [id number] Status is pending

## 2024-10-19 NOTE — Telephone Encounter (Signed)
 Pharmacy Patient Advocate Encounter  Received notification from [facility] ADVANTAGE/RX ADVANCE that Prior Authorization for  Cyclobenzaprine  HCl 5MG  tablets  has been APPROVED from 10/19/24 to 11/16/24. Ran test claim, Copay is $1.06. This test claim was processed through [facility] copay amounts may vary at other pharmacies due to pharmacy/plan contracts, or as the patient moves through the different stages of their insurance plan.   PA #/Case ID/Reference #: [id number]

## 2025-02-15 ENCOUNTER — Encounter: Admitting: Dermatology

## 2025-10-04 ENCOUNTER — Encounter: Admitting: Family Medicine
